# Patient Record
Sex: Female | Born: 1970 | Race: Black or African American | Hispanic: No | Marital: Single | Smoking: Former smoker
Health system: Southern US, Community
[De-identification: ages and names within clinical notes are randomized; demographics above are authoritative.]

## PROBLEM LIST (undated history)

## (undated) HISTORY — PX: ABDOMINAL HYSTERECTOMY: SHX81

---

## 2005-04-26 ENCOUNTER — Emergency Department: Payer: Self-pay | Admitting: Emergency Medicine

## 2005-05-17 ENCOUNTER — Ambulatory Visit: Payer: Self-pay | Admitting: Obstetrics and Gynecology

## 2006-05-02 ENCOUNTER — Ambulatory Visit: Payer: Self-pay | Admitting: General Practice

## 2006-05-17 ENCOUNTER — Ambulatory Visit: Payer: Self-pay | Admitting: General Practice

## 2006-09-03 ENCOUNTER — Emergency Department: Payer: Self-pay | Admitting: Unknown Physician Specialty

## 2006-10-27 IMAGING — US US PELV - US TRANSVAGINAL
1 series · 17 of 25 positions shown · non-contrast
Comparison: none

REASON FOR EXAM: Pelvic pain
COMMENTS:   LMP; HYSTERECTOMY

[Series 1: us pelv - us transvaginal · 17 of 58 slices shown]
[im 1/58]
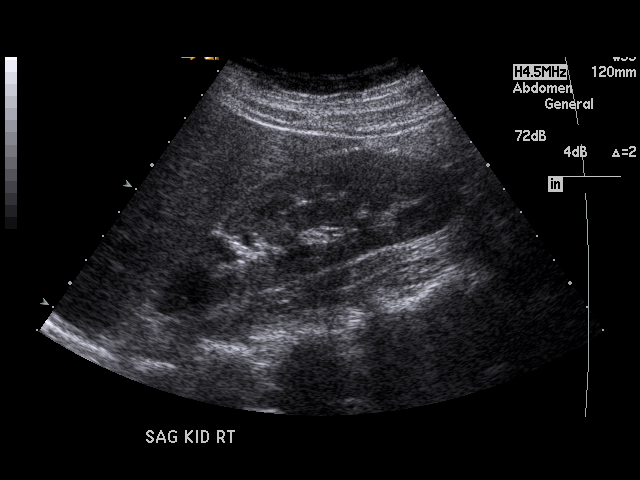
[im 5/58]
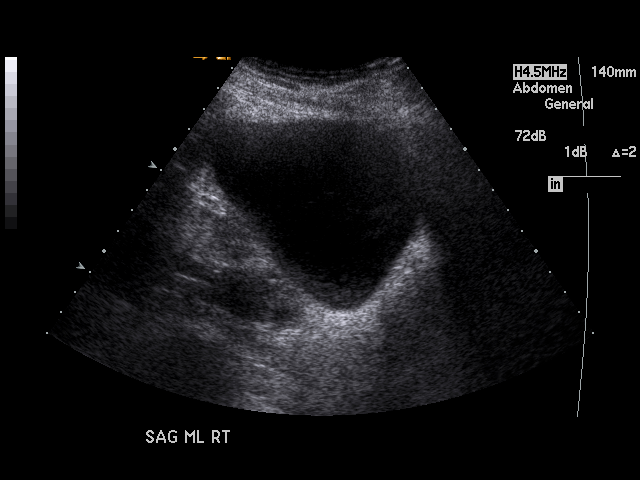
[im 8/58]
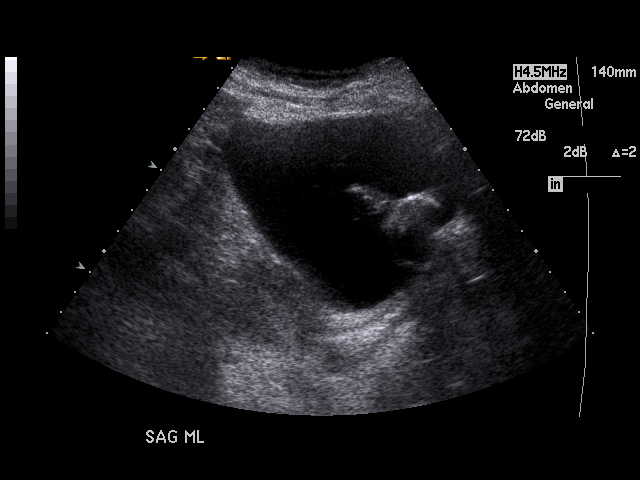
[im 12/58]
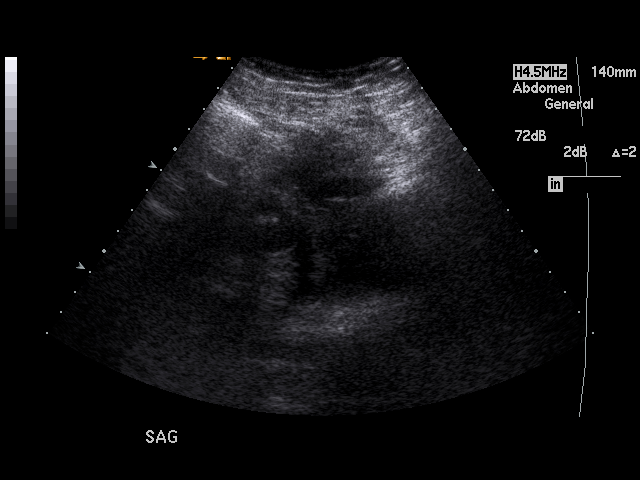
[im 15/58]
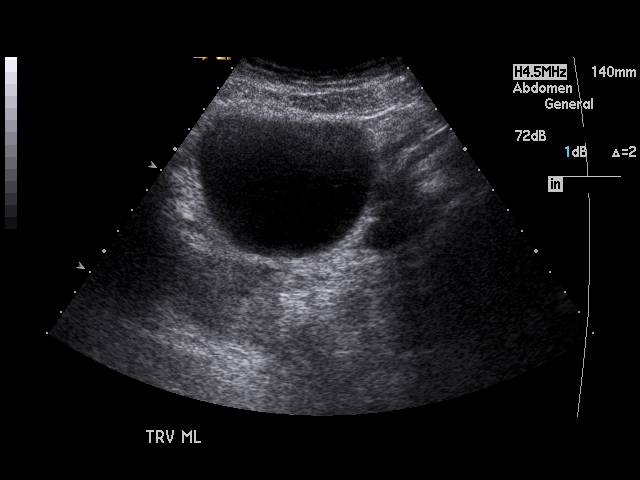
[im 20/58]
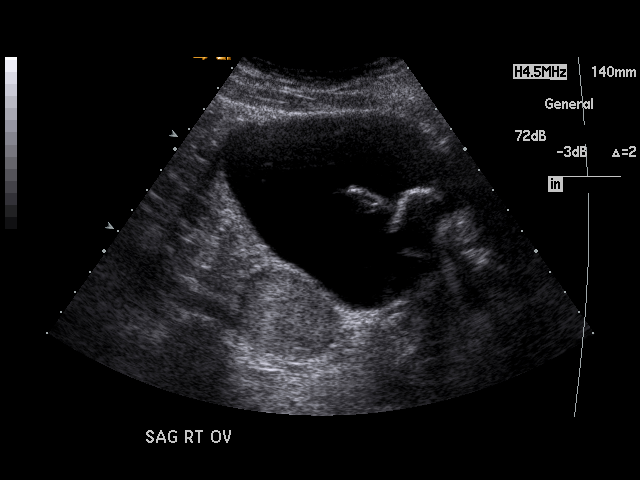
[im 22/58]
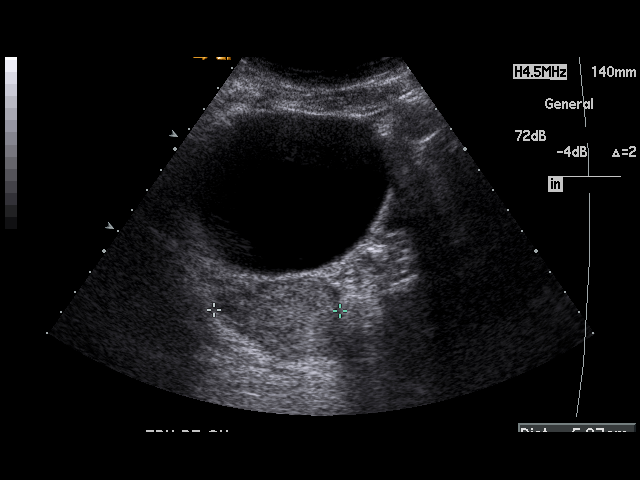
[im 27/58]
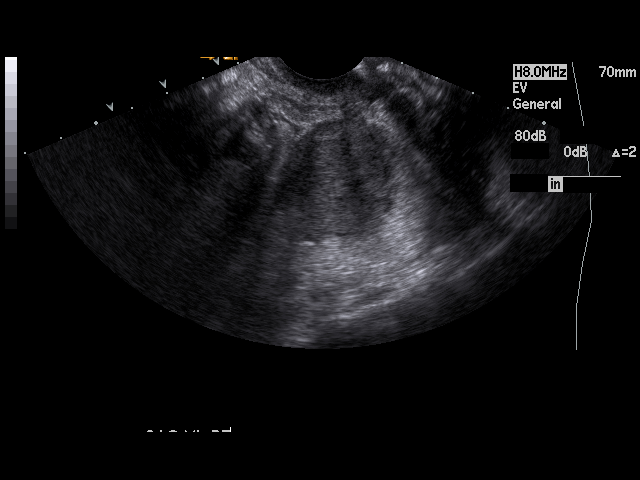
[im 29/58]
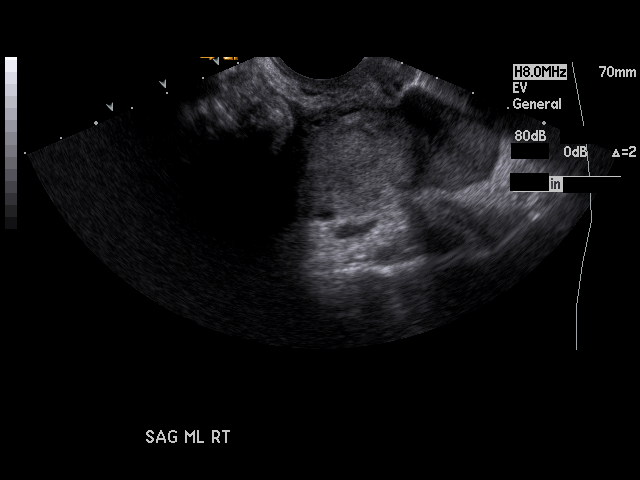
[im 31/58]
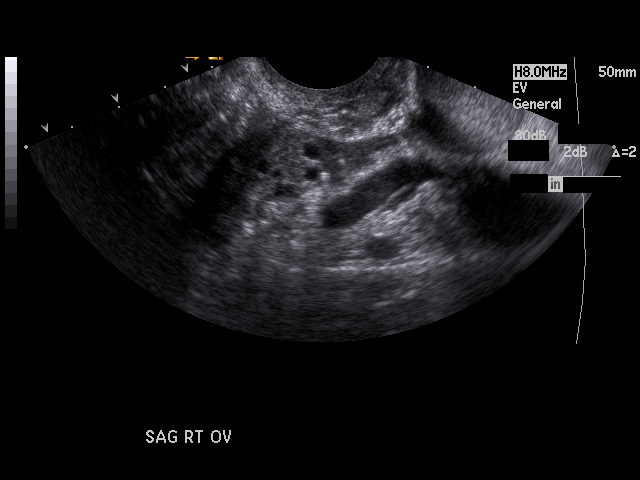
[im 36/58]
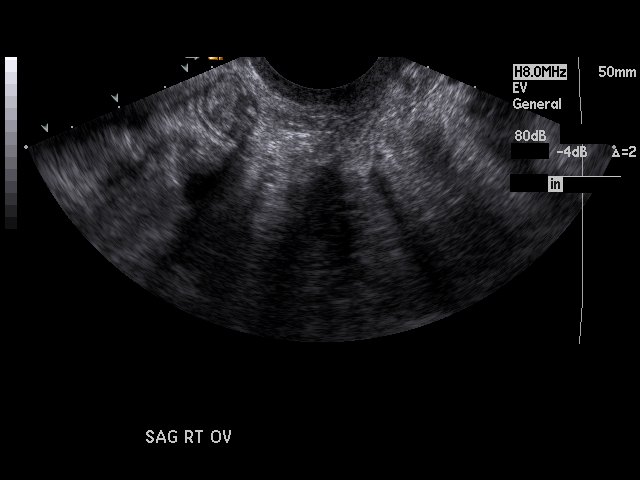
[im 39/58]
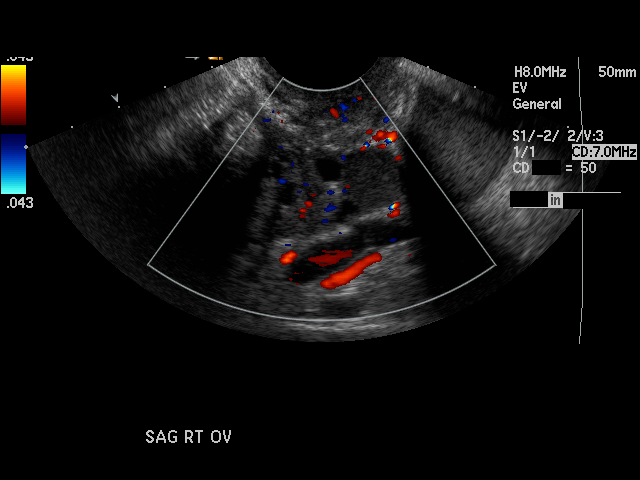
[im 43/58]
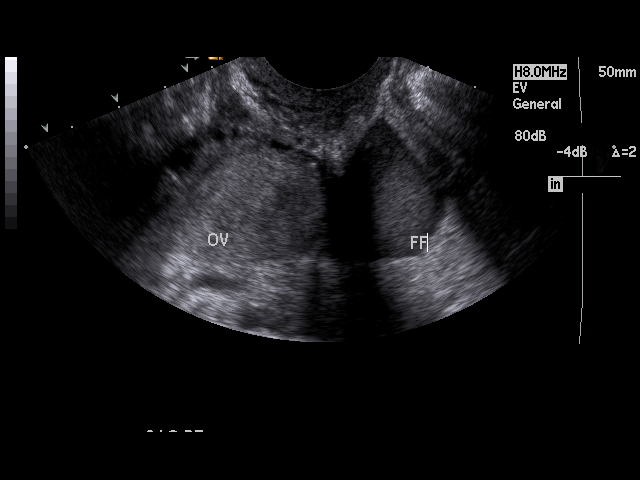
[im 46/58]
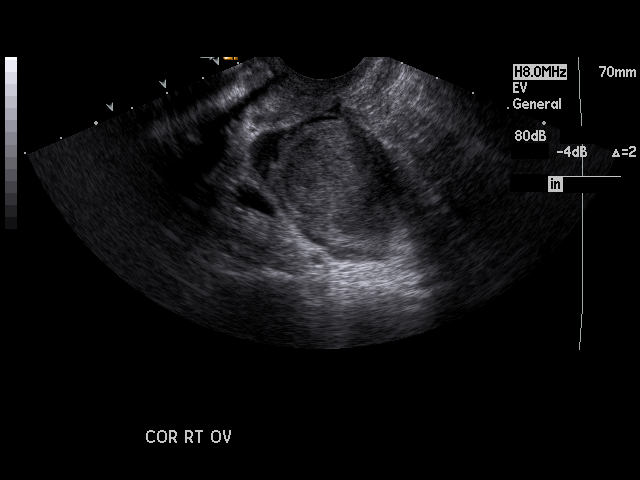
[im 50/58]
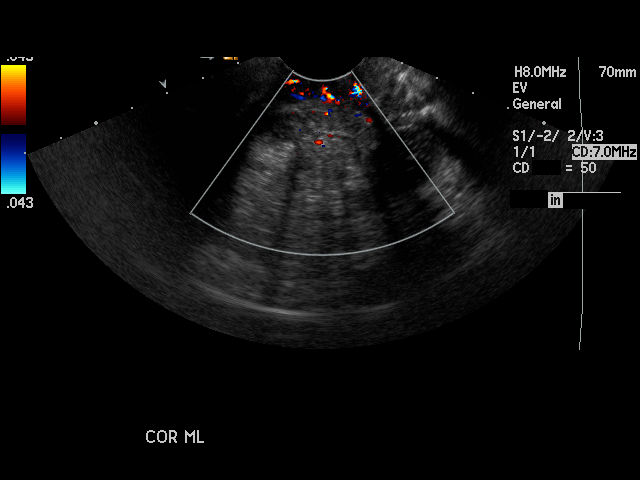
[im 53/58]
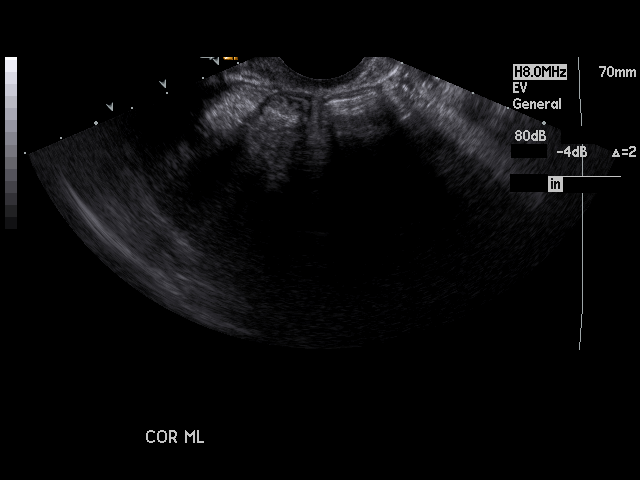
[im 58/58]
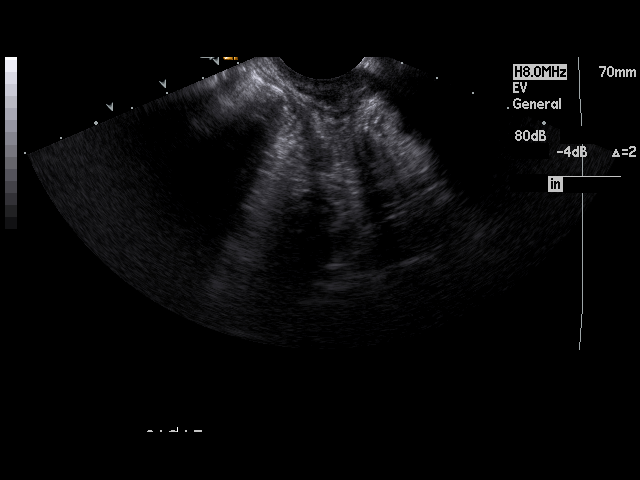

[17 of 25 positions shown; findings below may reference images not displayed]

PROCEDURE:     US  - US PELVIS MASS EXAM  - [DATE] [DATE] [DATE] [DATE]

RESULT:     The patient is status post hysterectomy. On this exam, the LEFT
ovary is not seen. It is to me uncertain as to whether the LEFT ovary has
been removed or has become atrophic. In the RIGHT adnexal area, there is a
solid mass arising from or adjacent to the RIGHT ovary. The mass measures
3.78 cm at maximum diameter. Adjacent to the anterior aspect of this mass
there is observed normal appearing ovarian tissue containing a few
follicular cysts. There is a small amount of free fluid observed on the
RIGHT. The visualized portion of the urinary bladder is normal in
appearance. The kidneys show no hydronephrosis.
IMPRESSION: 1.     There is a 3.78 cm, solid mass arising from or adjacent to the RIGHT
ovary. Although not mentioned above, the mass appears hypovascular on
Doppler examination.
2.     The LEFT ovary is not identified in this exam.
3.     The patient is status post hysterectomy.
4.     There is a small amount of free fluid in the RIGHT pelvis.

## 2006-11-17 IMAGING — CT CT PELVIS W/ CM
1 series · 15 of 27 positions shown, 19 images · non-contrast
Comparison: none

REASON FOR EXAM: Ovarian mass
COMMENTS:

[Series 2: abdomen · axial · 0.62mm/px · z∈[-362,-170]mm · 15 of 27 slices shown, 19 images]
[im 2/27  soft-tissue]
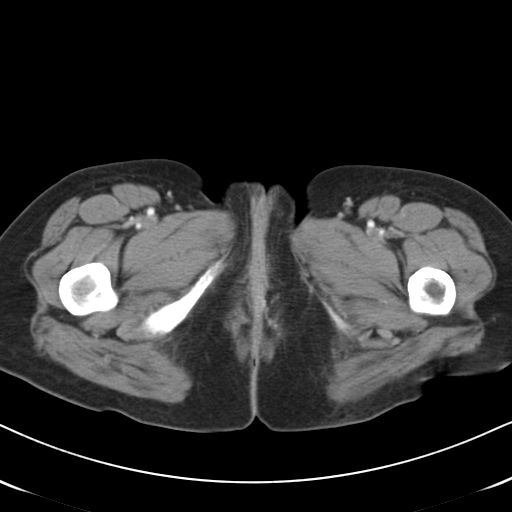
[im 2/27  bone]
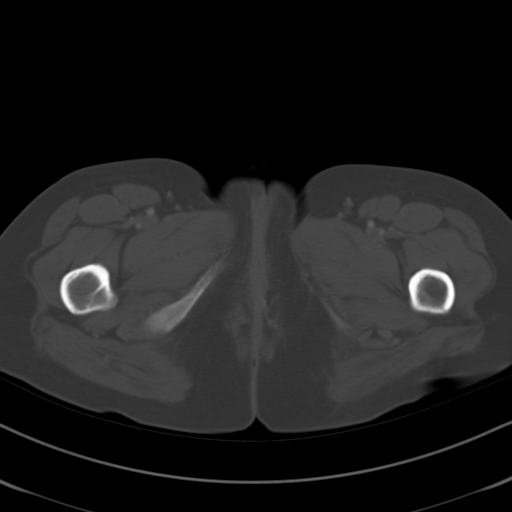
[im 4/27  soft-tissue]
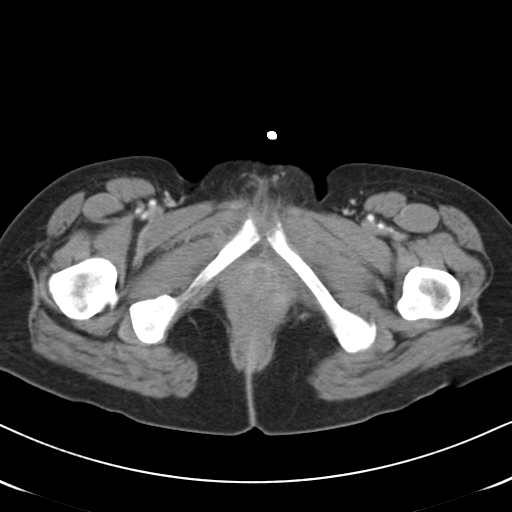
[im 6/27  soft-tissue]
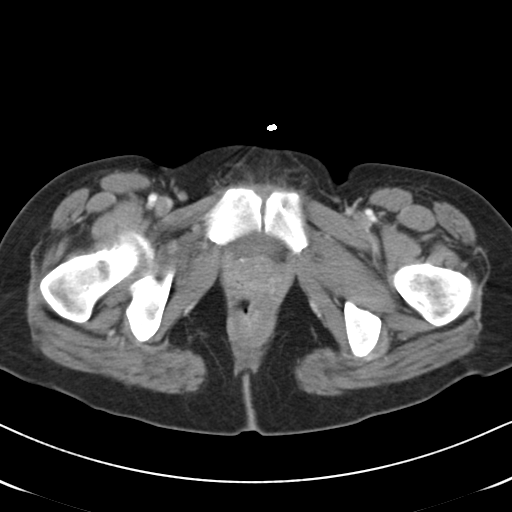
[im 8/27  soft-tissue]
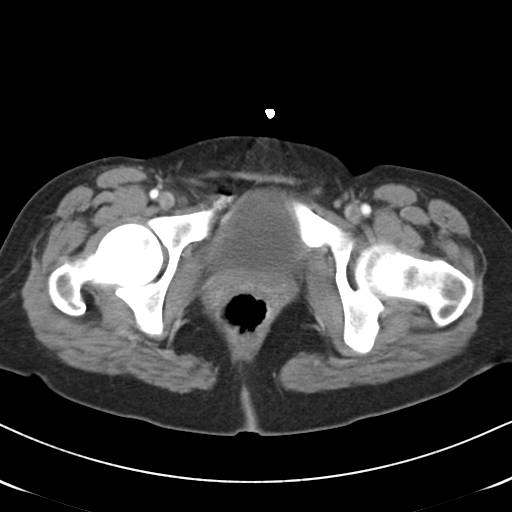
[im 10/27  soft-tissue]
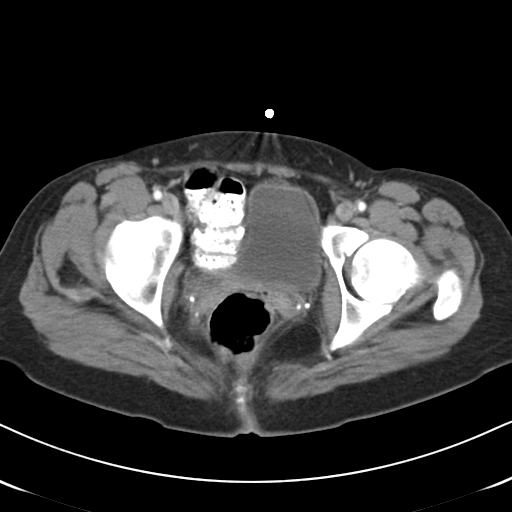
[im 12/27  soft-tissue]
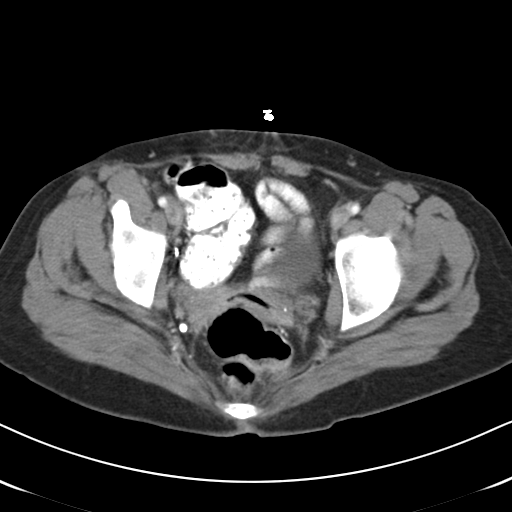
[im 14/27  soft-tissue]
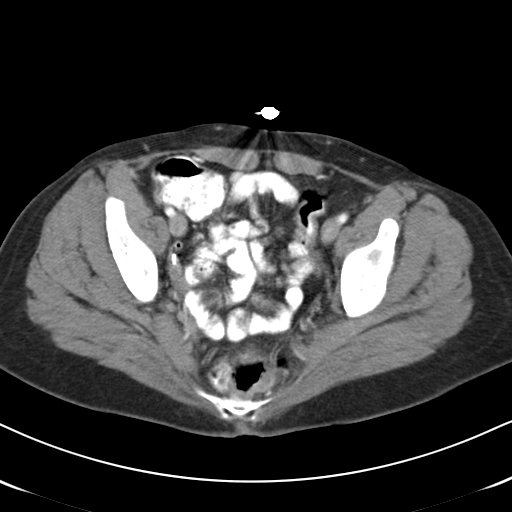
[im 16/27  soft-tissue]
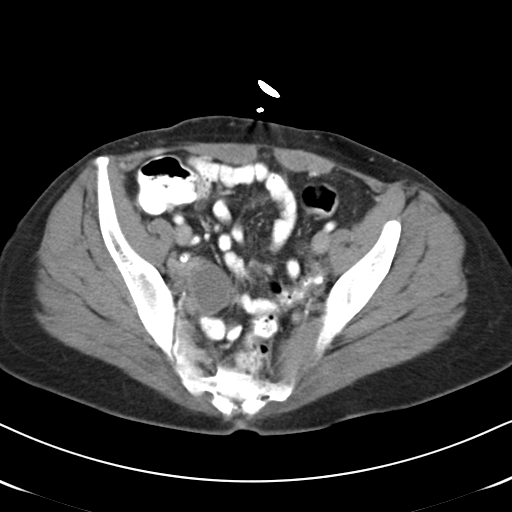
[im 18/27  soft-tissue]
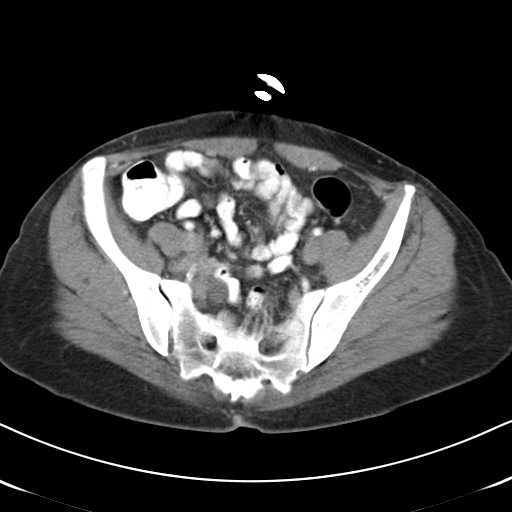
[im 18/27  bone]
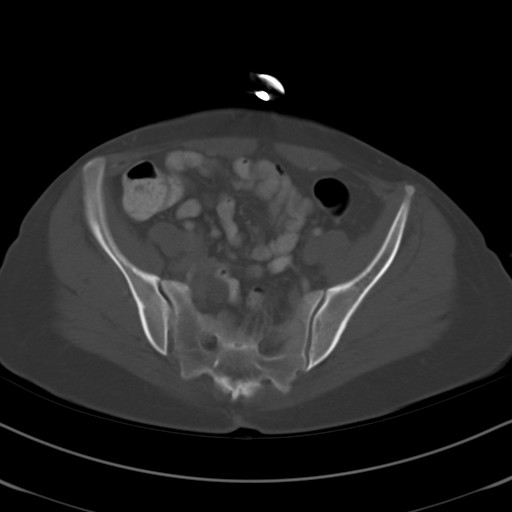
[im 20/27  soft-tissue]
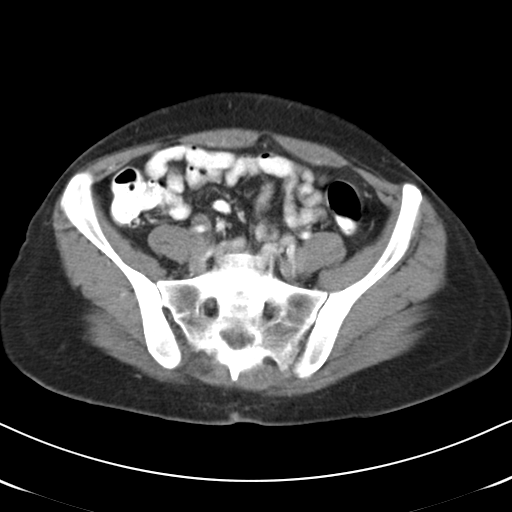
[im 22/27  soft-tissue]
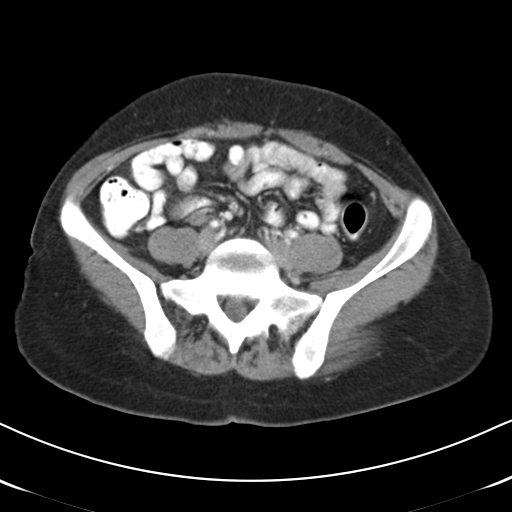
[im 23/27  lung]
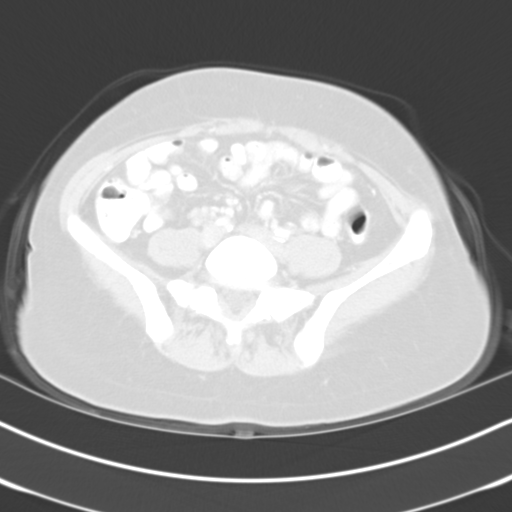
[im 24/27  soft-tissue]
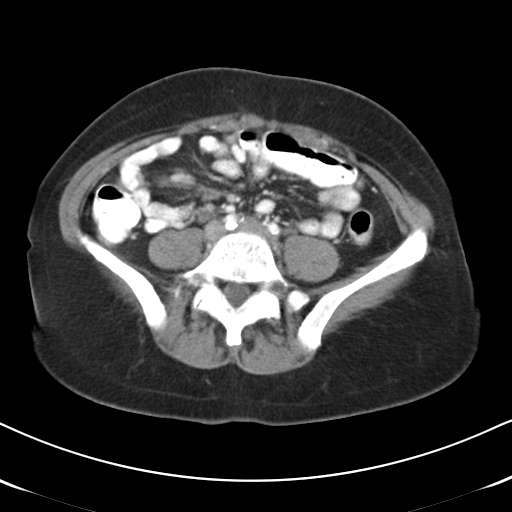
[im 24/27  lung]
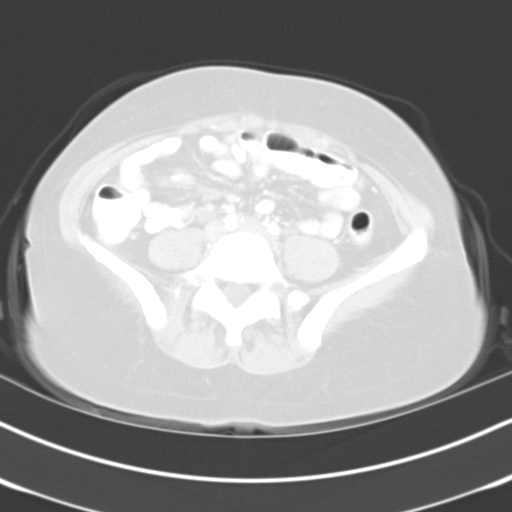
[im 25/27  lung]
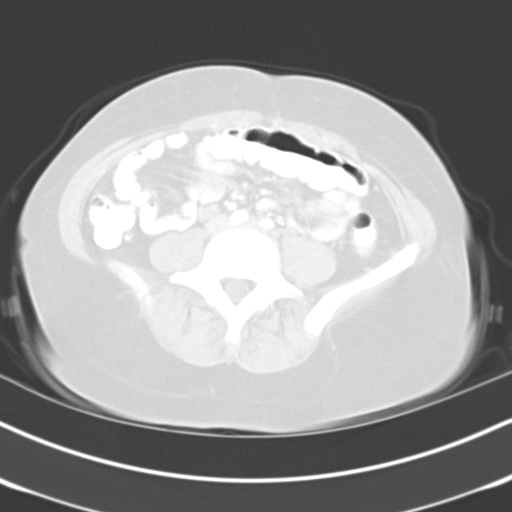
[im 26/27  soft-tissue]
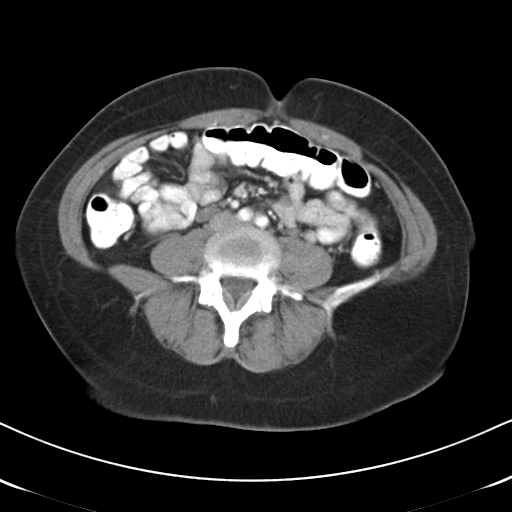
[im 26/27  lung]
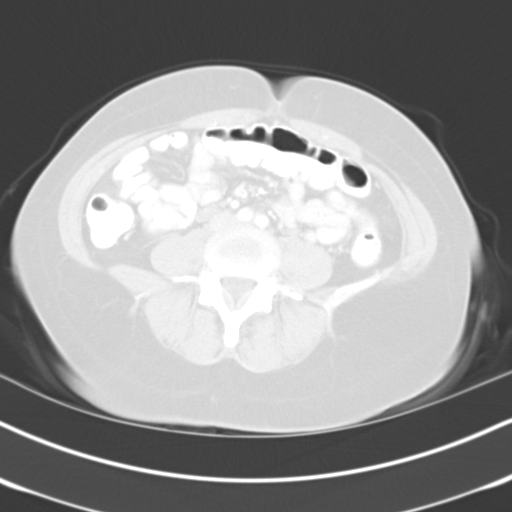

[15 of 27 positions shown; findings below may reference images not displayed]

PROCEDURE:     CT  - CT PELVIS STANDARD W  - May 17, 2005  [DATE]

RESULT:     Spiral 8-mm sections were obtained from the superior iliac crest
through the pubic symphysis status post intravenous administration of 100 ml
of Ssovue-NNY and oral contrast.

Evaluation of the pelvis demonstrates a low-attenuating mass within the
RIGHT adnexal region which appears to correspond to a previously described
mass on ultrasound dated 04/26/05.  This mass measures 2.85 x 2.43 cm in AP
x transverse dimensions.  Hounsfield units of this mass are approximately
30.  Separate RIGHT ovarian tissue is not clearly identified and this
appears to be associated with the RIGHT ovary.  Differential considerations
are possibly a complex and/or hemorrhage cyst.  Previous Doppler evaluation
demonstrated no evidence of appreciable flow within the mass.  There appears
to be peripheral soft tissue component likely representing ovarian tissue
and considering these findings again differential considerations are a
complex or hemorrhagic cyst.  Reevaluation of this area in six to eight
weeks is recommended with ultrasound if and as clinically warranted.  No
further pelvic masses, free fluid, or drainable loculated fluid collections
are identified.
IMPRESSION: Low-attenuating mass in the RIGHT adnexa as described above.  This
demonstrates Hounsfield units of 30.  There does not appear to be
appreciable enhancement within this mass and peripheral soft tissue
enhancement is appreciated likely representing surrounding ovarian tissue.
Differential considerations at this time are a complex or hemorrhagic
ovarian cyst and reevaluation with ultrasound in 6-8 weeks is recommended if
and as clinically warranted.  No further pelvic masses are identified.
Other differential considerations are possibly endometrioma which was not
mentioned above, and again this can be monitored with ultrasound.

## 2007-05-07 ENCOUNTER — Ambulatory Visit: Payer: Self-pay | Admitting: General Practice

## 2015-10-30 ENCOUNTER — Emergency Department: Payer: Self-pay

## 2015-10-30 ENCOUNTER — Encounter: Payer: Self-pay | Admitting: Emergency Medicine

## 2015-10-30 ENCOUNTER — Emergency Department
Admission: EM | Admit: 2015-10-30 | Discharge: 2015-10-30 | Disposition: A | Discharge status: Home / Self Care | Payer: Self-pay | Attending: Emergency Medicine | Admitting: Emergency Medicine

## 2015-10-30 DIAGNOSIS — Y9259 Other trade areas as the place of occurrence of the external cause: Secondary | ICD-10-CM | POA: Insufficient documentation

## 2015-10-30 DIAGNOSIS — W228XXA Striking against or struck by other objects, initial encounter: Secondary | ICD-10-CM | POA: Insufficient documentation

## 2015-10-30 DIAGNOSIS — Y939 Activity, unspecified: Secondary | ICD-10-CM | POA: Insufficient documentation

## 2015-10-30 DIAGNOSIS — F17200 Nicotine dependence, unspecified, uncomplicated: Secondary | ICD-10-CM | POA: Insufficient documentation

## 2015-10-30 DIAGNOSIS — Y999 Unspecified external cause status: Secondary | ICD-10-CM | POA: Insufficient documentation

## 2015-10-30 DIAGNOSIS — S92512A Displaced fracture of proximal phalanx of left lesser toe(s), initial encounter for closed fracture: Secondary | ICD-10-CM | POA: Insufficient documentation

## 2015-10-30 DIAGNOSIS — S92912A Unspecified fracture of left toe(s), initial encounter for closed fracture: Secondary | ICD-10-CM

## 2015-10-30 MED ORDER — OXYCODONE-ACETAMINOPHEN 5-325 MG PO TABS
1.0000 | ORAL_TABLET | Freq: Once | ORAL | Status: AC
  Administered 2015-10-30: 1 via ORAL
  Filled 2015-10-30: qty 1

## 2015-10-30 MED ORDER — IBUPROFEN 800 MG PO TABS
800.0000 mg | ORAL_TABLET | Freq: Three times a day (TID) | ORAL | Status: AC | PRN
Start: 2015-10-30 — End: ?

## 2015-10-30 MED ORDER — OXYCODONE-ACETAMINOPHEN 7.5-325 MG PO TABS: 1.0000 | ORAL_TABLET | ORAL | Status: AC | PRN

## 2015-10-30 MED ORDER — IBUPROFEN 800 MG PO TABS
800.0000 mg | ORAL_TABLET | Freq: Once | ORAL | Status: AC
  Administered 2015-10-30: 800 mg via ORAL
  Filled 2015-10-30: qty 1

## 2015-10-30 NOTE — ED Notes (Signed)
L foot pain since Wednesday, cart ran over foot in [employer], pain and swelling since.

## 2015-10-30 NOTE — ED Notes (Signed)
Discussed discharge instructions, prescriptions, and follow-up care with patient. No questions or concerns at this time. Pt stable at discharge.  

## 2015-10-30 NOTE — Discharge Instructions (Signed)
Keep toes buddy taped and wear orthopedic shoe until evaluation by orthopedic clinic

## 2015-10-30 NOTE — ED Provider Notes (Signed)
[facility] Emergency Department Provider Note  ____________________________________________  Time seen: Approximately 12:45 PM  I have reviewed the triage vital signs and the nursing notes.   HISTORY  Chief Complaint Foot Pain    HPI Tiffany Spence is a 45 y.o. female patient complaining of left dorsal foot pain secondary to contusion by shopping cart at [employer] 3 days ago. Patient states continue pain edema to the top part of her foot. Patient states that relieved for over-the-counter anti-inflammatory medications. Patient rates her pain discomfort as a 10 over 10. Patient described a pain shot. Patient stated pain increases with ambulation. No other palliative measures taken for this complaint.   History reviewed. No pertinent past medical history.  There are no active problems to display for this patient.   Past Surgical History  Procedure Laterality Date  . Abdominal hysterectomy      Current Outpatient Rx  Name  Route  Sig  Dispense  Refill  . ibuprofen (ADVIL,MOTRIN) 800 MG tablet   Oral   Take 1 tablet (800 mg total) by mouth every 8 (eight) hours as needed.   30 tablet   0   . oxyCODONE-acetaminophen (PERCOCET) 7.5-325 MG tablet   Oral   Take 1 tablet by mouth every 4 (four) hours as needed for severe pain.   20 tablet   0     Allergies Review of patient's allergies indicates no known allergies.  No family history on file.  Social History Social History  Substance Use Topics  . Smoking status: Current Some Day Smoker  . Smokeless tobacco: None  . Alcohol Use: Yes    Review of Systems Constitutional: No fever/chills Eyes: No visual changes. ENT: No sore throat. Cardiovascular: Denies chest pain. Respiratory: Denies shortness of breath. Gastrointestinal: No abdominal pain.  No nausea, no vomiting.  No diarrhea.  No constipation. Genitourinary: Negative for dysuria. Musculoskeletal: Left  foot pain. Skin: Negative for  rash. Neurological: Negative for headaches, focal weakness or numbness.    ____________________________________________   PHYSICAL EXAM:  VITAL SIGNS: ED Triage Vitals  Enc Vitals Group     BP 10/30/15 1221 144/81 mmHg     Pulse Rate 10/30/15 1221 77     Resp 10/30/15 1221 18     Temp 10/30/15 1221 98 F (36.7 C)     Temp src --      SpO2 10/30/15 1221 100 %     Weight 10/30/15 1221 125 lb (56.7 kg)     Height 10/30/15 1221  (1.651 m)     Head Cir --      Peak Flow --      Pain Score 10/30/15 1223 10     Pain Loc --      Pain Edu? --      Excl. in GC? --     Constitutional: Alert and oriented. Well appearing and in no acute distress. Eyes: Conjunctivae are normal. PERRL. EOMI. Head: Atraumatic. Nose: No congestion/rhinnorhea. Mouth/Throat: Mucous membranes are moist.  Oropharynx non-erythematous. Neck: No stridor.  No cervical spine tenderness to palpation. Hematological/Lymphatic/Immunilogical: No cervical lymphadenopathy. Cardiovascular: Normal rate, regular rhythm. Grossly normal heart sounds.  Good peripheral circulation. Respiratory: Normal respiratory effort.  No retractions. Lungs CTAB. Gastrointestinal: Soft and nontender. No distention. No abdominal bruits. No CVA tenderness. Musculoskeletal: No obvious deformity of the left foot. Moderate edema to the dorsal aspect of the left foot. Neurovascular intact. Patient atypical gait secondary to pain.  Neurologic:  Normal speech and language.  No gross focal neurologic deficits are appreciated. No gait instability. Skin:  Skin is warm, dry and intact. No rash noted. Psychiatric: Mood and affect are normal. Speech and behavior are normal.  ____________________________________________   LABS (all labs ordered are listed, but only abnormal results are displayed)  Labs Reviewed - No data to  display ____________________________________________  EKG   ____________________________________________  RADIOLOGY   fracture mid phalange fourth digit left foot. ____________________________________________   PROCEDURES  Procedure(s) performed: None  Critical Care performed: No  ____________________________________________   INITIAL IMPRESSION / ASSESSMENT AND PLAN / ED COURSE  Pertinent labs & imaging results that were available during my care of the patient were reviewed by me and considered in my medical decision making (see chart for details).  Actual toenail left foot. Discuss x-ray finding with patient. Patient feels a buddy taped and she placed an open shoe for comfort. Patient advised follow orthopedics clinic to call for pulmonary Monday morning. Patient given a prescription for Percocets and ibuprofen. ____________________________________________   FINAL CLINICAL IMPRESSION(S) / ED DIAGNOSES  Final diagnoses:  Toe fracture, left, closed, initial encounter      Joni ReiningRonald K Emmalyne Giacomo, PA-C 10/30/15 1334  Jeanmarie PlantJames A McShane, MD 10/30/15 [phone]

## 2016-10-30 ENCOUNTER — Emergency Department
Admission: EM | Admit: 2016-10-30 | Discharge: 2016-10-30 | Disposition: A | Discharge status: Home / Self Care | Payer: Self-pay | Attending: Emergency Medicine | Admitting: Emergency Medicine

## 2016-10-30 ENCOUNTER — Encounter: Payer: Self-pay | Admitting: *Deleted

## 2016-10-30 DIAGNOSIS — M5441 Lumbago with sciatica, right side: Secondary | ICD-10-CM | POA: Insufficient documentation

## 2016-10-30 DIAGNOSIS — M5431 Sciatica, right side: Secondary | ICD-10-CM

## 2016-10-30 DIAGNOSIS — F17200 Nicotine dependence, unspecified, uncomplicated: Secondary | ICD-10-CM | POA: Insufficient documentation

## 2016-10-30 MED ORDER — CYCLOBENZAPRINE HCL 10 MG PO TABS: 10.0000 mg | ORAL_TABLET | Freq: Three times a day (TID) | ORAL | 0 refills | Status: DC | PRN

## 2016-10-30 MED ORDER — HYDROCODONE-ACETAMINOPHEN 5-325 MG PO TABS: 1.0000 | ORAL_TABLET | ORAL | 0 refills | Status: AC | PRN

## 2016-10-30 MED ORDER — MELOXICAM 15 MG PO TABS: 15.0000 mg | ORAL_TABLET | Freq: Every day | ORAL | 0 refills | Status: DC

## 2016-10-30 MED ORDER — PREDNISONE 10 MG (21) PO TBPK: ORAL_TABLET | ORAL | 0 refills | Status: DC

## 2016-10-30 NOTE — ED Triage Notes (Signed)
States right hip pain for over 1 year, states pain gets worse at night, ambulatory

## 2016-10-30 NOTE — ED Notes (Signed)
See triage note   States she has had right hip pain for some time  Pain has increased over the past 3 days ambulates with slight limp

## 2016-10-30 NOTE — ED Provider Notes (Signed)
[facility] Emergency Department Provider Note ____________________________________________  Time seen: Approximately 11:18 AM  I have reviewed the triage vital signs and the nursing notes.   HISTORY  Chief Complaint Hip Pain    HPI Tiffany Spence is a 46 y.o. female who presents to the emergency department for evaluation of right hip pain. Pain intermittent since about a year ago, but has increased over the past 3 days. No new injury.Pain starts in the lower back and "runs" down the right hip and leg. No relief with ibuprofen and tylenol.  History reviewed. No pertinent past medical history.  There are no active problems to display for this patient.   Past Surgical History:  Procedure Laterality Date  . ABDOMINAL HYSTERECTOMY      Prior to Admission medications   Medication Sig Start Date End Date Taking? Authorizing Provider  cyclobenzaprine (FLEXERIL) 10 MG tablet Take 1 tablet (10 mg total) by mouth 3 (three) times daily as needed for muscle spasms. 10/30/16   Chinita Pester, FNP  HYDROcodone-acetaminophen (NORCO/VICODIN) 5-325 MG tablet Take 1 tablet by mouth every 4 (four) hours as needed for moderate pain. 10/30/16 10/30/17  Chinita Pester, FNP  ibuprofen (ADVIL,MOTRIN) 800 MG tablet Take 1 tablet (800 mg total) by mouth every 8 (eight) hours as needed. 10/30/15   Joni Reining, PA-C  meloxicam (MOBIC) 15 MG tablet Take 1 tablet (15 mg total) by mouth daily. Start this medicine after you finish the prednisone tapered dose pack 10/30/16   Sabryna Lahm B Rayburn Mundis, FNP  predniSONE (STERAPRED UNI-PAK 21 TAB) 10 MG (21) TBPK tablet Take 6 tablets on day 1 Take 5 tablets on day 2 Take 4 tablets on day 3 Take 3 tablets on day 4 Take 2 tablets on day 5 Take 1 tablet on day 6 10/30/16   Chinita Pester, FNP    Allergies Patient has no known allergies.  History reviewed. No pertinent family history.  Social History Social History  Substance Use Topics  .  Smoking status: Current Some Day Smoker  . Smokeless tobacco: Not on file  . Alcohol use Yes    Review of Systems Constitutional: No recent illness. Cardiovascular: Denies chest pain or palpitations. Respiratory: Denies shortness of breath. Musculoskeletal: Pain in right lower back with radiation into the hip. Skin: Negative for rash, wound, lesion. Neurological: Negative for focal weakness or numbness.  ____________________________________________   PHYSICAL EXAM:  VITAL SIGNS: ED Triage Vitals  Enc Vitals Group     BP 10/30/16 0927 (!) 155/85     Pulse Rate 10/30/16 0927 71     Resp 10/30/16 0927 17     Temp 10/30/16 0927 98.2 F (36.8 C)     Temp Source 10/30/16 0927 Oral     SpO2 10/30/16 0927 100 %     Weight 10/30/16 0928 130 lb (59 kg)     Height 10/30/16 0928  (1.676 m)     Head Circumference --      Peak Flow --      Pain Score 10/30/16 0933 10     Pain Loc --      Pain Edu? --      Excl. in GC? --     Constitutional: Alert and oriented. Well appearing and in no acute distress. Eyes: Conjunctivae are normal. EOMI. Head: Atraumatic. Neck: No stridor.  Respiratory: Normal respiratory effort.   Musculoskeletal: Straight leg raise positive on the right at about 40.*  Neurologic:  Normal speech  and language. No gross focal neurologic deficits are appreciated. Speech is normal. No gait instability. Skin:  Skin is warm, dry and intact. Atraumatic. Psychiatric: Mood and affect are normal. Speech and behavior are normal.  ____________________________________________   LABS (all labs ordered are listed, but only abnormal results are displayed)  Labs Reviewed - No data to display ____________________________________________  RADIOLOGY  Not indicated. ____________________________________________   PROCEDURES  Procedure(s) performed: None  ____________________________________________   INITIAL IMPRESSION / ASSESSMENT AND PLAN / ED COURSE  46  year old female who presents to the emergency department for evaluation of right low back and hip pain. Pain and symptoms consistent with acute sciatica. She will be treated with the medications listed below. She is to follow up with the PCP of her choice for symptoms that are not improving over the week. She is to return to the ER for symptoms that change or worsen if unable to schedule an appointment.  Pertinent labs & imaging results that were available during my care of the patient were reviewed by me and considered in my medical decision making (see chart for details).  _________________________________________   FINAL CLINICAL IMPRESSION(S) / ED DIAGNOSES  Final diagnoses:  Sciatica of right side    Discharge Medication List as of 10/30/2016 11:32 AM    START taking these medications   Details  cyclobenzaprine (FLEXERIL) 10 MG tablet Take 1 tablet (10 mg total) by mouth 3 (three) times daily as needed for muscle spasms., Starting Mon 10/30/2016, Print    HYDROcodone-acetaminophen (NORCO/VICODIN) 5-325 MG tablet Take 1 tablet by mouth every 4 (four) hours as needed for moderate pain., Starting Mon 10/30/2016, Until Tue 10/30/2017, Print    meloxicam (MOBIC) 15 MG tablet Take 1 tablet (15 mg total) by mouth daily. Start this medicine after you finish the prednisone tapered dose pack, Starting Mon 10/30/2016, Print    predniSONE (STERAPRED UNI-PAK 21 TAB) 10 MG (21) TBPK tablet Take 6 tablets on day 1 Take 5 tablets on day 2 Take 4 tablets on day 3 Take 3 tablets on day 4 Take 2 tablets on day 5 Take 1 tablet on day 6, Print        If controlled substance prescribed during this visit, 12 month history viewed on the [facility] prior to issuing an initial prescription for Schedule II or III opiod.    Chinita Pester, FNP 11/01/16 1515    Governor Rooks, MD 11/07/16 [phone]

## 2017-05-01 IMAGING — CR DG FOOT COMPLETE 3+V*L*
1 series · 3 of 3 positions shown · non-contrast
Comparison: No priors.

CLINICAL DATA: 44-year-old female with history of trauma after left
foot was run over by a shopping cart, complaining of pain across the
top of the distal third, fourth and fifth metatarsals.

EXAM:
LEFT FOOT - COMPLETE 3+ VIEW

[Series 1: x foot ap left · 0.14mm/px · 3 of 3 slices shown]
[im 1/3]
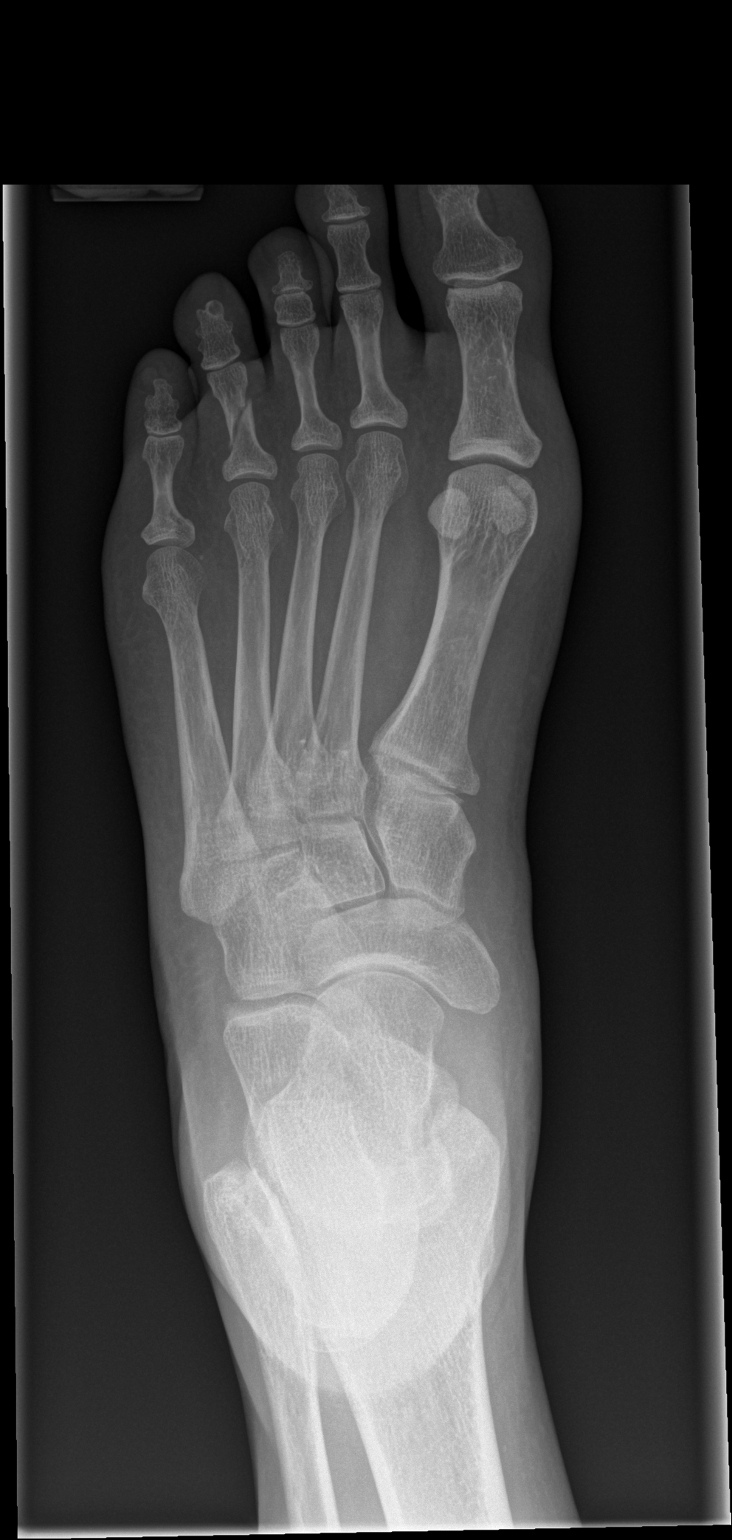
[im 2/3]
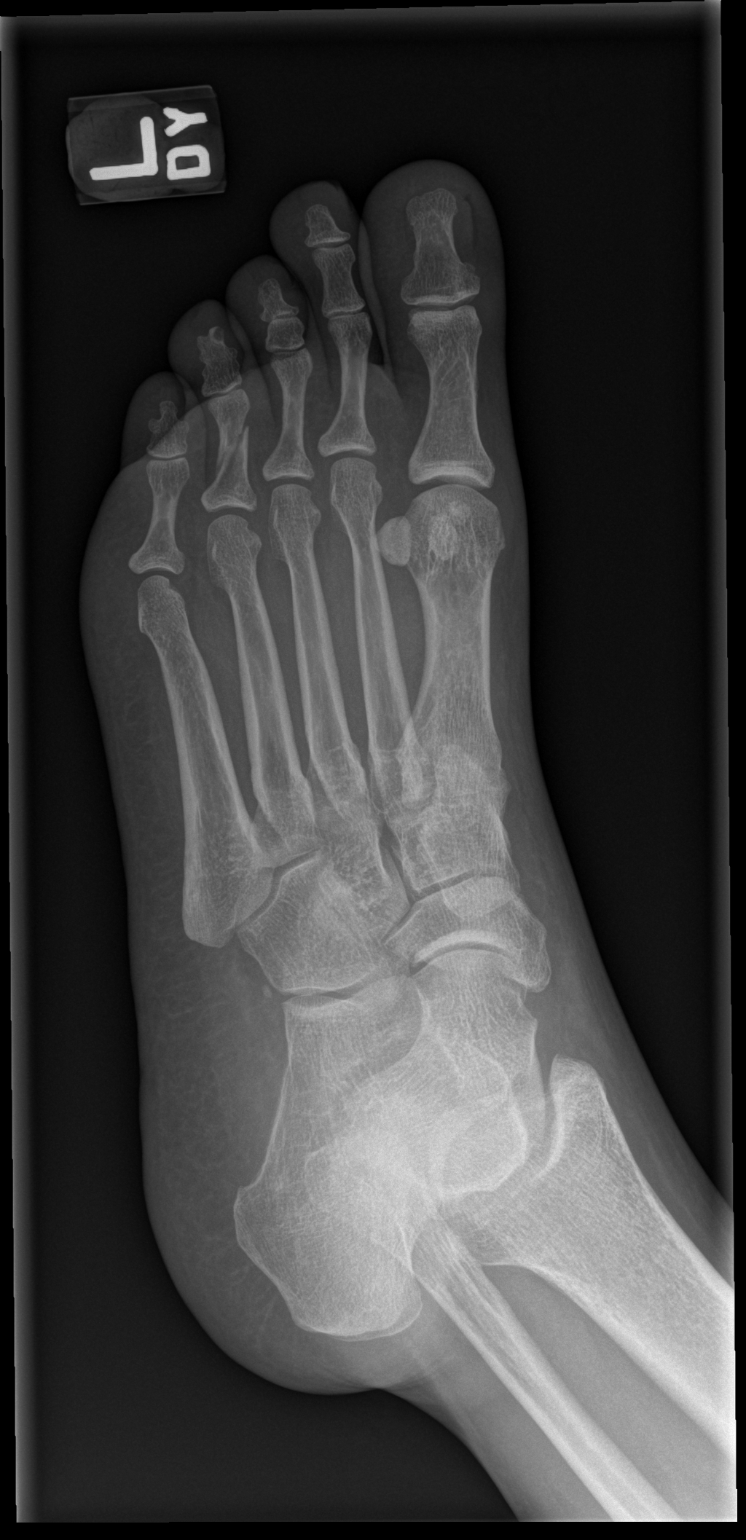
[im 3/3]
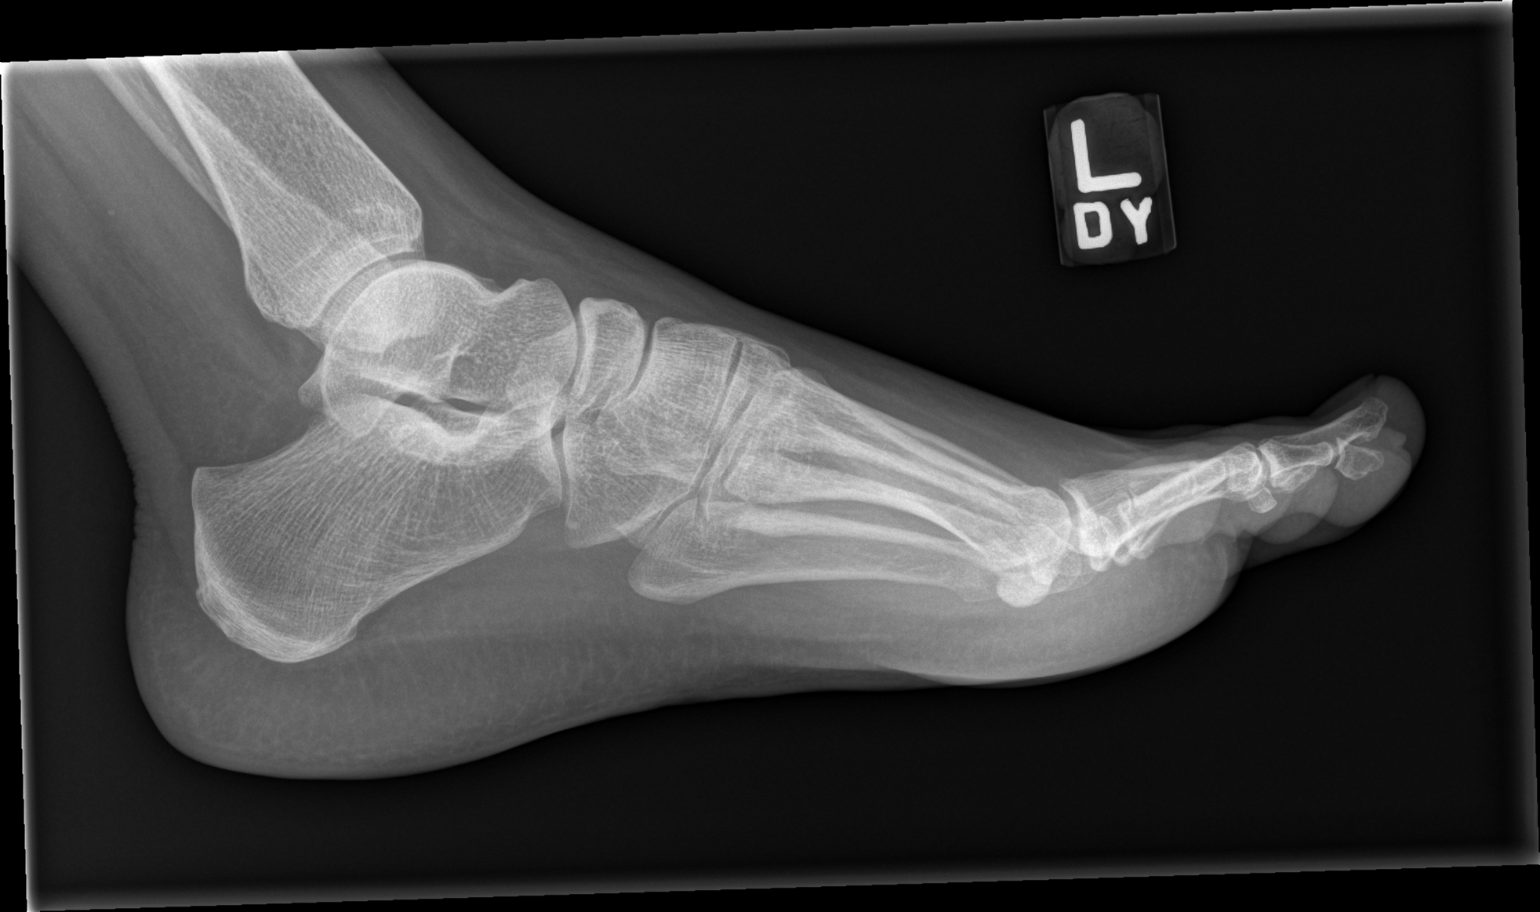

[3 of 3 positions shown; findings below may reference images not displayed]

FINDINGS: Oblique minimally displaced fracture of the fourth proximal phalanx
in the region of the diaphysis. 1 mm of lateral displacement of the
distal fracture fragment. No other acute displaced fracture,
subluxation or dislocation is noted. Specifically, the metatarsals
are intact.
IMPRESSION: 1. Oblique minimally displaced fracture of the fourth proximal
phalanx, as above.

## 2021-08-04 ENCOUNTER — Ambulatory Visit: Payer: Self-pay | Admitting: *Deleted

## 2021-08-04 ENCOUNTER — Emergency Department
Admission: EM | Admit: 2021-08-04 | Discharge: 2021-08-04 | Disposition: A | Discharge status: Home / Self Care | Payer: Self-pay | Attending: Emergency Medicine | Admitting: Emergency Medicine

## 2021-08-04 ENCOUNTER — Other Ambulatory Visit: Payer: Self-pay

## 2021-08-04 DIAGNOSIS — S01511A Laceration without foreign body of lip, initial encounter: Secondary | ICD-10-CM | POA: Insufficient documentation

## 2021-08-04 DIAGNOSIS — W19XXXA Unspecified fall, initial encounter: Secondary | ICD-10-CM | POA: Insufficient documentation

## 2021-08-04 DIAGNOSIS — L03211 Cellulitis of face: Secondary | ICD-10-CM | POA: Insufficient documentation

## 2021-08-04 DIAGNOSIS — Z23 Encounter for immunization: Secondary | ICD-10-CM | POA: Insufficient documentation

## 2021-08-04 MED ORDER — CEFTRIAXONE SODIUM 1 G IJ SOLR
1.0000 g | Freq: Once | INTRAMUSCULAR | Status: AC
  Administered 2021-08-04: 1 g via INTRAMUSCULAR
  Filled 2021-08-04: qty 10

## 2021-08-04 MED ORDER — HYDROCODONE-ACETAMINOPHEN 5-325 MG PO TABS: 1.0000 | ORAL_TABLET | Freq: Four times a day (QID) | ORAL | 0 refills | Status: AC | PRN

## 2021-08-04 MED ORDER — CLINDAMYCIN PHOSPHATE 600 MG/4ML IJ SOLN: 600.0000 mg | Freq: Once | INTRAMUSCULAR | Status: DC

## 2021-08-04 MED ORDER — CLINDAMYCIN HCL 300 MG PO CAPS: 300.0000 mg | ORAL_CAPSULE | Freq: Four times a day (QID) | ORAL | 0 refills | Status: AC

## 2021-08-04 MED ORDER — ONDANSETRON 4 MG PO TBDP: 4.0000 mg | ORAL_TABLET | Freq: Three times a day (TID) | ORAL | 0 refills | Status: AC | PRN

## 2021-08-04 MED ORDER — TETANUS-DIPHTH-ACELL PERTUSSIS 5-2.5-18.5 LF-MCG/0.5 IM SUSY
0.5000 mL | PREFILLED_SYRINGE | Freq: Once | INTRAMUSCULAR | Status: AC
  Administered 2021-08-04: 0.5 mL via INTRAMUSCULAR
  Filled 2021-08-04: qty 0.5

## 2021-08-04 MED ORDER — AMOXICILLIN-POT CLAVULANATE 875-125 MG PO TABS: 1.0000 | ORAL_TABLET | Freq: Two times a day (BID) | ORAL | 0 refills | Status: AC

## 2021-08-04 NOTE — Telephone Encounter (Signed)
° ° ° ° °  Chief Complaint: Mouth injury S/P fall Symptoms: Drainage "Pus" from lesion at lip, gum. Frequency: Saturday S/P fall Pertinent Negatives: Patient denies fevr, tooth injury Disposition: [] ED /[] Urgent Care (no appt availability in office) / [] Appointment(In office/virtual)/ []  [facility]/ [] Home Care/ [] Refused Recommended Disposition /[] [facility]/ []  Follow-up with PCP Additional Notes: Advised UC, Pt needs tetanus as well.   Reason for Disposition  [1] No prior tetanus shots (or is not fully vaccinated) AND [2] any wound (e.g., cut or scrape)  Answer Assessment - Initial Assessment Questions 1. MECHANISM: "How did the fall happen?"     Saturday 2. DOMESTIC VIOLENCE AND ELDER ABUSE SCREENING: "Did you fall because someone pushed you or tried to hurt you?" If Yes, ask: "Are you safe now?"     no 3. ONSET: "When did the fall happen?" (e.g., minutes, hours, or days ago)     Tripped 4. LOCATION: "What part of the body hit the ground?" (e.g., back, buttocks, head, hips, knees, hands, head, stomach)     [place], lip 5. INJURY: "Did you hurt (injure) yourself when you fell?" If Yes, ask: "What did you injure? Tell me more about this?" (e.g., body area; type of injury; pain severity)"     Mouth 6. PAIN: "Isthere any pain?" If Yes, ask: "How bad is the pain?" (e.g., Scale 1-10; or mild,  moderate, severe)   - NONE (0): No pain   - MILD (1-3): Doesn't interfere with normal activities    - MODERATE (4-7): Interferes with normal activities or awakens from sleep    - SEVERE (8-10): Excruciating pain, unable to do any normal activities      *No Answer* 7. SIZE: For cuts, bruises, or swelling, ask: "How large is it?" (e.g., inches or centimeters)        9. OTHER SYMPTOMS: "Do you have any other symptoms?" (e.g., dizziness, fever, weakness; new onset or worsening).      no 10. CAUSE: "What do you think caused the fall (or falling)?" (e.g., tripped, dizzy spell)        Tripped  Protocols used: Falls and [facility]

## 2021-08-04 NOTE — ED Triage Notes (Signed)
Pt states she busted her lower lip a few days ago and is having some foul drainage and is concerned for infection

## 2021-08-04 NOTE — Discharge Instructions (Addendum)
Take Clindamycin four times daily for the next seven days.  Take Augmentin twice daily for the next seven daysl You have been prescribed Norco for pain. You can take Zofran with Norco to avoid nausea. Please start probiotic in order to introduce good gut bacteria back in your body. Return to the emergency department with worsening pain or redness/streaking around the face.

## 2021-08-04 NOTE — ED Provider Notes (Addendum)
[facility] Provider Note  Patient Contact: 4:12 PM (approximate)   History   Laceration   HPI  Tiffany Spence is a 51 y.o. female presents to the emergency department with concern for lower lip cellulitis.  Patient had a mechanical fall 2 to 3 days ago.  Patient states that she has noticed some worsening pain and expression of purulence as well as a malodorous smell from wound site.  She denies history of cellulitis in the past.  Patient also states it has been over 10 years since she has had tetanus shot.      Physical Exam   Triage Vital Signs: ED Triage Vitals  Enc Vitals Group     BP 08/04/21 1303 (!) 168/98     Pulse Rate 08/04/21 1303 80     Resp 08/04/21 1303 16     Temp 08/04/21 1303 98.1 F (36.7 C)     Temp Source 08/04/21 1303 Oral     SpO2 08/04/21 1303 98 %     Weight 08/04/21 1424 130 lb 1.1 oz (59 kg)     Height 08/04/21 1424 5\' 6"  (1.676 m)     Head Circumference --      Peak Flow --      Pain Score --      Pain Loc --      Pain Edu? --      Excl. in GC? --     Most recent vital signs: Vitals:   08/04/21 1303  BP: (!) 168/98  Pulse: 80  Resp: 16  Temp: 98.1 F (36.7 C)  SpO2: 98%     General: Alert and in no acute distress. Eyes:  PERRL. EOMI. Head: No acute traumatic findings ENT:      Nose: No congestion/rhinnorhea.      Mouth/Throat: Mucous membranes are moist.  Patient has macerated lower lip laceration with overlying slough and scabbing.  Lower lip appears mildly edematous with no frank expression of purulence. Neck: No stridor. No cervical spine tenderness to palpation. Cardiovascular:  Good peripheral perfusion Respiratory: Normal respiratory effort without tachypnea or retractions. Lungs CTAB. Good air entry to the bases with no decreased or absent breath sounds Gastrointestinal: Bowel sounds 4 quadrants. Soft and nontender to palpation. No guarding or rigidity. No palpable masses. No distention. No CVA  tenderness. Musculoskeletal: Full range of motion to all extremities.  Neurologic:  No gross focal neurologic deficits are appreciated.  Skin:   No rash noted Other:   ED Results / Procedures / Treatments   Labs (all labs ordered are listed, but only abnormal results are displayed) Labs Reviewed - No data to display     PROCEDURES:  Critical Care performed: No  Procedures   MEDICATIONS ORDERED IN ED: Medications  clindamycin (CLEOCIN) injection 600 mg (has no administration in time range)  Tdap (BOOSTRIX) injection 0.5 mL (has no administration in time range)     IMPRESSION / MDM / ASSESSMENT AND PLAN / ED COURSE  I reviewed the triage vital signs and the nursing notes.                              Assessment and plan:  Facial Cellulitis:  Differential diagnosis includes, but is not limited to, facial cellulitis  51 year old female presents to the emergency department with lower lip cellulitis.  Patient was hypertensive at triage but vital signs were otherwise reassuring.  Patient was alert, active  and nontoxic-appearing.  Patient did have cellulitis of the lower lip with no frank expression of purulence.  Low suspicion for abscess.  Will give patient IM ceftriaxone and discharge patient with oral clindamycin and Augmentin.  Spoke with pharmacist on-call Wallace Going who recommended Augmentin and Rocephin for Eikenella coverage. I recommended taking a probiotic while taking antibiotic.  Patient was prescribed a short course of Norco for pain. Return precautions were given to return if symptoms seem to be worsening at home.      FINAL CLINICAL IMPRESSION(S) / ED DIAGNOSES   Final diagnoses:  Facial cellulitis     Rx / DC Orders   ED Discharge Orders          Ordered    clindamycin (CLEOCIN) 300 MG capsule  4 times daily        08/04/21 1607    HYDROcodone-acetaminophen (NORCO) 5-325 MG tablet  Every 6 hours PRN        08/04/21 1607    ondansetron  (ZOFRAN-ODT) 4 MG disintegrating tablet  Every 8 hours PRN        08/04/21 1607             Note:  This document was prepared using Dragon voice recognition software and may include unintentional dictation errors.   Pia Mau Denham, PA-C 08/04/21 1616    Pia Mau [place], PA-C 08/04/21 1628    Gilles Chiquito, MD 08/04/21 1759

## 2021-08-04 NOTE — ED Notes (Signed)
See triage note  presents s/p fall   states she slipped and fell down face first  laceration and swelling noted to lower lip

## 2024-07-14 ENCOUNTER — Other Ambulatory Visit: Payer: Self-pay

## 2024-07-14 ENCOUNTER — Emergency Department: Payer: Self-pay

## 2024-07-14 ENCOUNTER — Emergency Department
Admission: EM | Admit: 2024-07-14 | Discharge: 2024-07-14 | Disposition: A | Discharge status: Home / Self Care | Payer: Self-pay | Attending: Emergency Medicine | Admitting: Emergency Medicine

## 2024-07-14 DIAGNOSIS — K610 Anal abscess: Secondary | ICD-10-CM | POA: Diagnosis not present

## 2024-07-14 DIAGNOSIS — R102 Pelvic and perineal pain unspecified side: Secondary | ICD-10-CM | POA: Diagnosis present

## 2024-07-14 LAB — COMPREHENSIVE METABOLIC PANEL WITH GFR
ALT: <5 U/L (ref 0–44)
AST: 17 U/L (ref 15–41)
Albumin: 3.4 g/dL — ABNORMAL LOW (ref 3.5–5.0)
Alkaline Phosphatase: 137 U/L — ABNORMAL HIGH (ref 38–126)
Anion gap: 15 (ref 5–15)
BUN: 8 mg/dL (ref 6–20)
CO2: 21 mmol/L — ABNORMAL LOW (ref 22–32)
Calcium: 8.7 mg/dL — ABNORMAL LOW (ref 8.9–10.3)
Chloride: 102 mmol/L (ref 98–111)
Creatinine, Ser: 0.68 mg/dL (ref 0.44–1.00)
GFR, Estimated: >60 mL/min
Glucose, Bld: 99 mg/dL (ref 70–99)
Potassium: 3.4 mmol/L — ABNORMAL LOW (ref 3.5–5.1)
Sodium: 138 mmol/L (ref 135–145)
Total Bilirubin: 1 mg/dL (ref 0.0–1.2)
Total Protein: 7.4 g/dL (ref 6.5–8.1)

## 2024-07-14 LAB — CBC WITH DIFFERENTIAL/PLATELET
Abs Immature Granulocytes: 0.11 K/uL — ABNORMAL HIGH (ref 0.00–0.07)
Basophils Absolute: 0.1 K/uL (ref 0.0–0.1)
Basophils Relative: 0 %
Eosinophils Absolute: 0 K/uL (ref 0.0–0.5)
Eosinophils Relative: 0 %
HCT: 39.6 % (ref 36.0–46.0)
Hemoglobin: 13.3 g/dL (ref 12.0–15.0)
Immature Granulocytes: 1 %
Lymphocytes Relative: 16 %
Lymphs Abs: 2.6 K/uL (ref 0.7–4.0)
MCH: 30.6 pg (ref 26.0–34.0)
MCHC: 33.6 g/dL (ref 30.0–36.0)
MCV: 91.2 fL (ref 80.0–100.0)
Monocytes Absolute: 1.3 K/uL — ABNORMAL HIGH (ref 0.1–1.0)
Monocytes Relative: 8 %
Neutro Abs: 12.9 K/uL — ABNORMAL HIGH (ref 1.7–7.7)
Neutrophils Relative %: 75 %
Platelets: 414 K/uL — ABNORMAL HIGH (ref 150–400)
RBC: 4.34 MIL/uL (ref 3.87–5.11)
RDW: 13.2 % (ref 11.5–15.5)
WBC: 17 K/uL — ABNORMAL HIGH (ref 4.0–10.5)
nRBC: 0 % (ref 0.0–0.2)

## 2024-07-14 MED ORDER — IOHEXOL 300 MG/ML  SOLN
100.0000 mL | Freq: Once | INTRAMUSCULAR | Status: AC | PRN
  Administered 2024-07-14: 100 mL via INTRAVENOUS

## 2024-07-14 MED ORDER — LIDOCAINE HCL (PF) 1 % IJ SOLN
3.0000 mL | INTRAMUSCULAR | Status: DC
  Administered 2024-07-14: 3 mL

## 2024-07-14 MED ORDER — LIDOCAINE-PRILOCAINE 2.5-2.5 % EX CREA: TOPICAL_CREAM | CUTANEOUS | Status: DC

## 2024-07-14 MED ORDER — LIDOCAINE HCL (PF) 1 % IJ SOLN
5.0000 mL | Freq: Once | INTRAMUSCULAR | Status: AC
  Administered 2024-07-14: 5 mL
  Filled 2024-07-14: qty 5

## 2024-07-14 MED ORDER — LIDOCAINE-PRILOCAINE 2.5-2.5 % EX CREA
TOPICAL_CREAM | Freq: Once | CUTANEOUS | Status: AC
  Filled 2024-07-14: qty 5

## 2024-07-14 MED ORDER — CEPHALEXIN 500 MG PO CAPS: 500.0000 mg | ORAL_CAPSULE | Freq: Three times a day (TID) | ORAL | 0 refills | Status: DC

## 2024-07-14 MED ORDER — OXYCODONE HCL 5 MG PO TABS
5.0000 mg | ORAL_TABLET | Freq: Once | ORAL | Status: AC
  Administered 2024-07-14: 5 mg via ORAL
  Filled 2024-07-14: qty 1

## 2024-07-14 MED ORDER — DOXYCYCLINE HYCLATE 100 MG PO CAPS: 100.0000 mg | ORAL_CAPSULE | Freq: Two times a day (BID) | ORAL | 0 refills | Status: DC

## 2024-07-14 MED ORDER — OXYCODONE-ACETAMINOPHEN 5-325 MG PO TABS: 1.0000 | ORAL_TABLET | Freq: Four times a day (QID) | ORAL | 0 refills | Status: DC | PRN

## 2024-07-14 NOTE — ED Provider Notes (Signed)
 "  [facility] Provider Note    Event Date/Time   First MD Initiated Contact with Patient 07/14/24 [phone]     (approximate)   History   Hemorrhoids   HPI  Tiffany Spence is a 53 y.o. female presents to the ED with complaint of hemorrhoids.  Patient has been using over-the-counter hemorrhoid cream without any relief.  She has a history of hemorrhoids.      Physical Exam   Triage Vital Signs: ED Triage Vitals  Encounter Vitals Group     BP 07/14/24 0824 (!) 153/114     Girls Systolic BP Percentile --      Girls Diastolic BP Percentile --      Boys Systolic BP Percentile --      Boys Diastolic BP Percentile --      Pulse Rate 07/14/24 0824 (!) 110     Resp 07/14/24 0824 18     Temp 07/14/24 0824 97.6 F (36.4 C)     Temp src --      SpO2 07/14/24 0824 100 %     Weight 07/14/24 0822 113 lb (51.3 kg)     Height 07/14/24 0822 5' 5 (1.651 m)     Head Circumference --      Peak Flow --      Pain Score 07/14/24 0822 10     Pain Loc --      Pain Education --      Exclude from Growth Chart --     Most recent vital signs: Vitals:   07/14/24 1058 07/14/24 1243  BP: (!) 148/90 (!) 140/88  Pulse: 99 88  Resp: 18 18  Temp:  98 F (36.7 C)  SpO2: 100% 99%     General: Awake, no distress.  Unable to sit secondary to pain. CV:  Good peripheral perfusion.  Resp:  Normal effort.  Abd:  No distention.  Other:  Rectal exam shows an area left lateral with soft tissue edema and markedly tender to light palpation.  No drainage.   ED Results / Procedures / Treatments   Labs (all labs ordered are listed, but only abnormal results are displayed) Labs Reviewed  CBC WITH DIFFERENTIAL/PLATELET - Abnormal; Notable for the following components:      Result Value   WBC 17.0 (*)    Platelets 414 (*)    Neutro Abs 12.9 (*)    Monocytes Absolute 1.3 (*)    Abs Immature Granulocytes 0.11 (*)    All other components within normal limits  COMPREHENSIVE  METABOLIC PANEL WITH GFR - Abnormal; Notable for the following components:   Potassium 3.4 (*)    CO2 21 (*)    Calcium 8.7 (*)    Albumin 3.4 (*)    Alkaline Phosphatase 137 (*)    All other components within normal limits     RADIOLOGY CT abdomen pelvis with contrast per radiology is positive for left perianal abscess.    PROCEDURES:  Critical Care performed:   .Incision and Drainage  Date/Time: 07/14/2024 12:55 PM  Performed by: Saunders Shona CROME, PA-C Authorized by: Saunders Shona CROME, PA-C   Consent:    Consent obtained:  Verbal   Consent given by:  Patient   Risks discussed:  Incomplete drainage, infection and pain Universal protocol:    Patient identity confirmed:  Verbally with patient Location:    Type:  Abscess   Location:  Anogenital   Anogenital location:  Perianal Pre-procedure details:    Skin  preparation:  Antiseptic wash Sedation:    Sedation type:  None Anesthesia:    Anesthesia method:  Topical application and local infiltration   Topical anesthetic:  Lidocaine -prilocaine 2.5-2.5 %   Local anesthetic:  3 mL lidocaine  (PF) 1 % Procedure type:    Complexity:  Simple Procedure details:    Incision types:  Single straight   Incision depth:  Dermal   Drainage:  Purulent   Drainage amount:  Copious   Wound treatment:  Drain placed   Packing materials:  1/4 in iodoform gauze Post-procedure details:    Procedure completion:  Tolerated    MEDICATIONS ORDERED IN ED: Medications  oxyCODONE  (Oxy IR/ROXICODONE ) immediate release tablet 5 mg (5 mg Oral Given 07/14/24 0855)  iohexol  (OMNIPAQUE ) 300 MG/ML solution 100 mL (100 mLs Intravenous Contrast Given 07/14/24 0949)  lidocaine  (PF) (XYLOCAINE ) 1 % injection 5 mL (5 mLs Infiltration Given 07/14/24 1236)  lidocaine -prilocaine (EMLA) cream ( Topical Given 07/14/24 1236)     IMPRESSION / MDM / ASSESSMENT AND PLAN / ED COURSE  I reviewed the triage vital signs and the nursing  notes.   Differential diagnosis includes, but is not limited to, perianal abscess, thrombosed hemorrhoid, fistula, pelvic abscess.  53 year old female presents to the ED with complaint of perianal pain and unable to sit.  Exam shows a perianal abscess and patient was made aware.  CT scan confirms.  I was able to feel a fluctuant area and patient tolerated I&D of this area well.  She is to follow-up with surgery if any continued problems.  Prescriptions for antibiotics was sent to the pharmacy along with prescription for Percocet as needed for pain.  She is made aware that there is a drain placed in the area and if she is unable to pull it out on her own she should return to the emergency department or follow-up with urgent care.  Also if she develops any worsening of her symptoms especially fever and chills she is to return to the emergency department for reevaluation.  Information about the surgeon on-call was given to the patient as well.      Patient's presentation is most consistent with acute illness / injury with system symptoms.  FINAL CLINICAL IMPRESSION(S) / ED DIAGNOSES   Final diagnoses:  Perianal abscess     Rx / DC Orders   ED Discharge Orders          Ordered    oxyCODONE -acetaminophen  (PERCOCET) 5-325 MG tablet  Every 6 hours PRN        07/14/24 1328    doxycycline  (VIBRAMYCIN ) 100 MG capsule  2 times daily        07/14/24 1328    cephALEXin  (KEFLEX ) 500 MG capsule  3 times daily        07/14/24 1328             Note:  This document was prepared using Dragon voice recognition software and may include unintentional dictation errors.   Saunders Shona CROME, PA-C 07/14/24 1340    Arlander Charleston, MD 07/14/24 1455  "

## 2024-07-14 NOTE — Discharge Instructions (Signed)
 Call make an appoint with Dr. Rodolph who is on-call for surgery if you continue to have problems.  2 prescriptions were sent to the pharmacy to begin taking for antibiotics.  Pain medication was also sent to take every 6 hours as needed for severe pain.  Do not drive or operate machinery while taking this medication as it could cause drowsiness.  The drain that was placed may fall out on its own.  If not and you are unable to pull it out return to the emergency department or urgent care.  If any severe worsening of your symptoms or fever return to the emergency department.

## 2024-07-14 NOTE — ED Triage Notes (Signed)
 Pt comes with hemorrhoids. Pt states intense rectal pain for the last few days. Pt has used otc meds with no relief.

## 2024-07-14 NOTE — ED Notes (Signed)
 See triage note  Presents with possible hemorrhoid pain  States developed pain couple of days ago  Has been using cream w/o relief Has possible abscess area noted on exam

## 2024-07-24 ENCOUNTER — Ambulatory Visit: Payer: Self-pay | Admitting: General Surgery

## 2024-07-24 NOTE — Progress Notes (Addendum)
 " History of Present Illness Tiffany Spence is a 54 year old female with recent perianal abscess who presents for evaluation of recurrent perianal abscess.  She initially developed severe perianal pain on December 29th, 2025, describing the pain as debilitating and limiting ambulation. She initially attributed symptoms to hemorrhoids and attempted self-treatment with topical agents without relief.  She presented to the emergency department on December 29th, 2025, where a CT abdomen identified a perianal abscess. She underwent bedside incision and drainage by a physician assistant and was discharged on doxycycline  and cephalexin . She completed a short course of pain medication and continues the prescribed antibiotics. A drain was placed but subsequently dislodged spontaneously.  Currently, she reports persistent discomfort and a palpable nodule in the perianal region. She denies ongoing drainage. She is not taking any medications other than antibiotics and is not on anticoagulation.      PAST MEDICAL HISTORY:  Medical history reviewed.  No pertinent medical history     PAST SURGICAL HISTORY:   Past Surgical History:  Procedure Laterality Date   ABDOMINAL HYSTERECTOMY W/ PARTIAL VAGINACTOMY  1997   all but 1 ovary   ovary removed Right          MEDICATIONS:  Outpatient Encounter Medications as of 07/24/2024  Medication Sig Dispense Refill   cephalexin  (KEFLEX ) 500 MG capsule Take 500 mg by mouth 3 (three) times daily     doxycycline  (VIBRAMYCIN ) 100 MG capsule Take 100 mg by mouth 2 (two) times daily     ibuprofen  (ADVIL ,MOTRIN ) 800 MG tablet Take by mouth.     No facility-administered encounter medications on file as of 07/24/2024.     ALLERGIES:   Patient has no known allergies.   SOCIAL HISTORY:  Social History   Socioeconomic History   Marital status: Single  Tobacco Use   Smoking status: Former    Types: Cigarettes   Smokeless tobacco: Never  Vaping Use   Vaping  status: Every Day   Substances: Nicotine, Flavoring  Substance and Sexual Activity   Alcohol use: Yes   Drug use: Yes    Comment: marijuana occasionally   Social Drivers of Health   Financial Resource Strain: High Risk (07/24/2024)   Overall Financial Resource Strain (CARDIA)    Difficulty of Paying Living Expenses: Hard  Food Insecurity: Food Insecurity Present (07/24/2024)   Hunger Vital Sign    Worried About Running Out of Food in the Last Year: Sometimes true    Ran Out of Food in the Last Year: Sometimes true  Transportation Needs: Unmet Transportation Needs (07/24/2024)   PRAPARE - Administrator, Civil Service (Medical): Yes    Lack of Transportation (Non-Medical): No    FAMILY HISTORY:  Family History  Problem Relation Name Age of Onset   Diabetes Maternal Aunt       GENERAL REVIEW OF SYSTEMS:   General ROS: negative for - chills, fatigue, fever, weight gain or weight loss Allergy and Immunology ROS: negative for - hives  Hematological and Lymphatic ROS: negative for - bleeding problems or bruising, negative for palpable nodes Endocrine ROS: negative for - heat or cold intolerance, hair changes Respiratory ROS: negative for - cough, shortness of breath or wheezing Cardiovascular ROS: no chest pain or palpitations GI ROS: negative for nausea, vomiting, abdominal pain, diarrhea, constipation Musculoskeletal ROS: negative for - joint swelling or muscle pain Neurological ROS: negative for - confusion, syncope Dermatological ROS: negative for pruritus and rash  PHYSICAL EXAM:  Vitals:  07/24/24 1256  BP: (!) 154/98  Pulse: 94  .  Ht:165.1 cm (5' 5) Wt:(!) 39 kg (86 lb) ADJ:Anib surface area is 1.34 meters squared. Body mass index is 14.31 kg/m.SABRA   GENERAL: Alert, active, oriented x3  HEENT: Pupils equal reactive to light. Extraocular movements are intact. Sclera clear. Palpebral conjunctiva normal red color.Pharynx clear.  NECK: Supple with no  palpable mass and no adenopathy.  LUNGS: Sound clear with no rales rhonchi or wheezes.  HEART: Regular rhythm S1 and S2 without murmur.  ABDOMEN: Soft and depressible, nontender with no palpable mass, no hepatomegaly.   RECTAL: Bulging on the left posterior area with associated cellulitis consistent with a recurrent perianal abscess  EXTREMITIES: Well-developed well-nourished symmetrical with no dependent edema.  NEUROLOGICAL: Awake alert oriented, facial expression symmetrical, moving all extremities.   Results Radiology (I personally evaluated the images) Abdominal CT (07/14/2024): Perirectal abscess on the left lateral perirectal area    Assessment & Plan Recurrent Perianal abscess Recurrent perirectal abscess following prior bedside incision and drainage with incomplete resolution. Examination confirmed recurrence. She is not on anticoagulation and is currently receiving prescribed antibiotics.  Difficult situation due to recurrence.  Will need more advanced anesthesia than just local.  Scheduled operative incision and drainage under sedation at the earliest available opportunity, likely tomorrow. Advised continuation of current antibiotic regimen until the procedure.   Perianal abscess [K61.0]          Patient verbalized understanding, all questions were answered, and were agreeable with the plan outlined above.   Lucas Sjogren, MD  Electronically signed by Lucas Sjogren, MD  Addendum 07/24/2024 at 4:49 PM - Insurance company has not approved incision and drainage of perirectal abscess.  The endorses that they have up to 72 hours to approve this case.  This case is scheduled tomorrow at 7:15 AM due to the urgency of the situation of having a perirectal abscess.  I am available to perform this procedure tomorrow at 7:15 in the morning.  Patient was notified about the situation of not being approved by her insurance company which she will need to pay out-of-pocket if  the medical insurance does not approve the surgery.  Even though I am available to perform the procedure, patient decided to hold until it is approved by her insurance company.  Patient understand the risk of not treating perirectal abscess including worsening of infection, sepsis and even death.  She also decided to hold the surgery at this moment.  Unfortunately the system (her medical insurance) did not care about her wellbeing, being the situation and easy decision of approving the needed surgery in an urgent matter.  "

## 2024-07-25 ENCOUNTER — Ambulatory Visit: Admission: RE | Admit: 2024-07-25 | Source: Home / Self Care | Admitting: General Surgery

## 2024-07-25 ENCOUNTER — Encounter: Admission: RE | Payer: Self-pay | Source: Home / Self Care

## 2024-07-25 SURGERY — INCISION AND DRAINAGE, ABSCESS, PERIRECTAL
Anesthesia: Monitor Anesthesia Care | Site: Perineum

## 2024-07-26 ENCOUNTER — Encounter: Admission: EM | Disposition: A | Payer: Self-pay | Source: Home / Self Care

## 2024-07-26 ENCOUNTER — Emergency Department: Admitting: Certified Registered"

## 2024-07-26 ENCOUNTER — Other Ambulatory Visit: Payer: Self-pay

## 2024-07-26 ENCOUNTER — Observation Stay: Admission: EM | Admit: 2024-07-26 | Discharge: 2024-07-27 | Disposition: A

## 2024-07-26 DIAGNOSIS — K611 Rectal abscess: Principal | ICD-10-CM | POA: Diagnosis present

## 2024-07-26 DIAGNOSIS — F17290 Nicotine dependence, other tobacco product, uncomplicated: Secondary | ICD-10-CM | POA: Insufficient documentation

## 2024-07-26 HISTORY — PX: INCISION AND DRAINAGE PERIRECTAL ABSCESS: SHX1804

## 2024-07-26 LAB — CBC WITH DIFFERENTIAL/PLATELET
Abs Immature Granulocytes: 0.06 K/uL (ref 0.00–0.07)
Basophils Absolute: 0.1 K/uL (ref 0.0–0.1)
Basophils Relative: 0 %
Eosinophils Absolute: 0 K/uL (ref 0.0–0.5)
Eosinophils Relative: 0 %
HCT: 34.3 % — ABNORMAL LOW (ref 36.0–46.0)
Hemoglobin: 11.2 g/dL — ABNORMAL LOW (ref 12.0–15.0)
Immature Granulocytes: 1 %
Lymphocytes Relative: 23 %
Lymphs Abs: 2.6 K/uL (ref 0.7–4.0)
MCH: 30.5 pg (ref 26.0–34.0)
MCHC: 32.7 g/dL (ref 30.0–36.0)
MCV: 93.5 fL (ref 80.0–100.0)
Monocytes Absolute: 0.7 K/uL (ref 0.1–1.0)
Monocytes Relative: 7 %
Neutro Abs: 8 K/uL — ABNORMAL HIGH (ref 1.7–7.7)
Neutrophils Relative %: 69 %
Platelets: 458 K/uL — ABNORMAL HIGH (ref 150–400)
RBC: 3.67 MIL/uL — ABNORMAL LOW (ref 3.87–5.11)
RDW: 13.2 % (ref 11.5–15.5)
WBC: 11.5 K/uL — ABNORMAL HIGH (ref 4.0–10.5)
nRBC: 0 % (ref 0.0–0.2)

## 2024-07-26 LAB — BASIC METABOLIC PANEL WITH GFR
Anion gap: 8 (ref 5–15)
BUN: 9 mg/dL (ref 6–20)
CO2: 26 mmol/L (ref 22–32)
Calcium: 9.2 mg/dL (ref 8.9–10.3)
Chloride: 100 mmol/L (ref 98–111)
Creatinine, Ser: 0.6 mg/dL (ref 0.44–1.00)
GFR, Estimated: >60 mL/min
Glucose, Bld: 96 mg/dL (ref 70–99)
Potassium: 4.3 mmol/L (ref 3.5–5.1)
Sodium: 135 mmol/L (ref 135–145)

## 2024-07-26 MED ORDER — PIPERACILLIN-TAZOBACTAM 3.375 G IVPB
3.3750 g | Freq: Three times a day (TID) | INTRAVENOUS | Status: DC
  Administered 2024-07-26 – 2024-07-27 (×2): 3.375 g via INTRAVENOUS
  Filled 2024-07-26 (×2): qty 50

## 2024-07-26 MED ORDER — OXYCODONE HCL 5 MG/5ML PO SOLN: 5.0000 mg | Freq: Once | ORAL | Status: DC | PRN

## 2024-07-26 MED ORDER — MIDAZOLAM HCL (PF) 2 MG/2ML IJ SOLN
INTRAMUSCULAR | Status: DC | PRN
  Administered 2024-07-26: 2 mg via INTRAVENOUS

## 2024-07-26 MED ORDER — HYDROMORPHONE HCL 1 MG/ML IJ SOLN: 0.2500 mg | INTRAMUSCULAR | Status: DC | PRN

## 2024-07-26 MED ORDER — OXYCODONE HCL 5 MG PO TABS: 5.0000 mg | ORAL_TABLET | ORAL | Status: DC | PRN

## 2024-07-26 MED ORDER — LIDOCAINE HCL (CARDIAC) PF 100 MG/5ML IV SOSY
PREFILLED_SYRINGE | INTRAVENOUS | Status: DC | PRN
  Administered 2024-07-26: 100 mg via INTRAVENOUS

## 2024-07-26 MED ORDER — ONDANSETRON HCL 4 MG/2ML IJ SOLN
INTRAMUSCULAR | Status: AC
  Filled 2024-07-26: qty 20

## 2024-07-26 MED ORDER — MORPHINE SULFATE (PF) 2 MG/ML IV SOLN: 2.0000 mg | INTRAVENOUS | Status: DC | PRN

## 2024-07-26 MED ORDER — LACTATED RINGERS IV SOLN: INTRAVENOUS | Status: DC | PRN

## 2024-07-26 MED ORDER — OXYCODONE HCL 5 MG PO TABS: 5.0000 mg | ORAL_TABLET | Freq: Once | ORAL | Status: DC | PRN

## 2024-07-26 MED ORDER — FENTANYL CITRATE (PF) 100 MCG/2ML IJ SOLN
INTRAMUSCULAR | Status: DC | PRN
  Administered 2024-07-26: 100 ug via INTRAVENOUS

## 2024-07-26 MED ORDER — LIDOCAINE HCL (PF) 2 % IJ SOLN
INTRAMUSCULAR | Status: AC
  Filled 2024-07-26: qty 45

## 2024-07-26 MED ORDER — ENSURE PLUS HIGH PROTEIN PO LIQD
237.0000 mL | Freq: Two times a day (BID) | ORAL | Status: DC
  Administered 2024-07-26: 237 mL via ORAL

## 2024-07-26 MED ORDER — PIPERACILLIN-TAZOBACTAM 3.375 G IVPB
3.3750 g | Freq: Three times a day (TID) | INTRAVENOUS | Status: DC
  Administered 2024-07-26: 3.375 g via INTRAVENOUS
  Filled 2024-07-26: qty 50

## 2024-07-26 MED ORDER — OXYCODONE HCL 5 MG PO TABS: 10.0000 mg | ORAL_TABLET | ORAL | Status: DC | PRN

## 2024-07-26 MED ORDER — ACETAMINOPHEN 500 MG PO TABS
1000.0000 mg | ORAL_TABLET | Freq: Three times a day (TID) | ORAL | Status: DC
  Administered 2024-07-27: 1000 mg via ORAL
  Filled 2024-07-26 (×2): qty 2

## 2024-07-26 MED ORDER — ACETAMINOPHEN 500 MG PO TABS
1000.0000 mg | ORAL_TABLET | Freq: Once | ORAL | Status: AC
  Administered 2024-07-26: 1000 mg via ORAL
  Filled 2024-07-26: qty 2

## 2024-07-26 MED ORDER — ONDANSETRON HCL 4 MG/2ML IJ SOLN: 4.0000 mg | INTRAMUSCULAR | Status: DC | PRN

## 2024-07-26 MED ORDER — DEXMEDETOMIDINE HCL IN NACL 80 MCG/20ML IV SOLN
INTRAVENOUS | Status: AC
  Filled 2024-07-26: qty 20

## 2024-07-26 MED ORDER — DEXAMETHASONE SOD PHOSPHATE PF 10 MG/ML IJ SOLN
INTRAMUSCULAR | Status: AC
  Filled 2024-07-26: qty 8

## 2024-07-26 MED ORDER — PROPOFOL 10 MG/ML IV BOLUS
INTRAVENOUS | Status: DC | PRN
  Administered 2024-07-26: 125 ug/kg/min via INTRAVENOUS

## 2024-07-26 MED ORDER — DEXAMETHASONE SOD PHOSPHATE PF 10 MG/ML IJ SOLN
INTRAMUSCULAR | Status: DC | PRN
  Administered 2024-07-26: 10 mg via INTRAVENOUS

## 2024-07-26 MED ORDER — MIDAZOLAM HCL 2 MG/2ML IJ SOLN
INTRAMUSCULAR | Status: AC
  Filled 2024-07-26: qty 2

## 2024-07-26 MED ORDER — FENTANYL CITRATE (PF) 100 MCG/2ML IJ SOLN
INTRAMUSCULAR | Status: AC
  Filled 2024-07-26: qty 2

## 2024-07-26 MED ORDER — KETOROLAC TROMETHAMINE 30 MG/ML IJ SOLN
INTRAMUSCULAR | Status: AC
  Filled 2024-07-26: qty 8

## 2024-07-26 MED ORDER — PROPOFOL 1000 MG/100ML IV EMUL
INTRAVENOUS | Status: AC
  Filled 2024-07-26: qty 100

## 2024-07-26 MED ORDER — 0.9 % SODIUM CHLORIDE (POUR BTL) OPTIME
TOPICAL | Status: DC | PRN
  Administered 2024-07-26: 500 mL

## 2024-07-26 MED ORDER — OXYCODONE HCL 5 MG PO TABS
5.0000 mg | ORAL_TABLET | Freq: Once | ORAL | Status: AC
  Administered 2024-07-26: 5 mg via ORAL
  Filled 2024-07-26: qty 1

## 2024-07-26 MED ORDER — ROCURONIUM BROMIDE 10 MG/ML (PF) SYRINGE
PREFILLED_SYRINGE | INTRAVENOUS | Status: AC
  Filled 2024-07-26: qty 40

## 2024-07-26 NOTE — Anesthesia Postprocedure Evaluation (Signed)
"   Anesthesia Post Note  Patient: Tiffany Spence  Procedure(s) Performed: INCISION AND DRAINAGE, ABSCESS, PERIRECTAL (Rectum)  Patient location during evaluation: PACU Anesthesia Type: General Level of consciousness: awake and alert Pain management: pain level controlled Vital Signs Assessment: post-procedure vital signs reviewed and stable Respiratory status: spontaneous breathing, nonlabored ventilation, respiratory function stable and patient connected to nasal cannula oxygen Cardiovascular status: blood pressure returned to baseline and stable Postop Assessment: no apparent nausea or vomiting Anesthetic complications: no   No notable events documented.   Last Vitals:  Vitals:   07/26/24 1105 07/26/24 1206  BP: (!) 153/82 (!) 130/100  Pulse: 62 83  Resp: 18 13  Temp: (!) 36.4 C (!) 36.1 C  SpO2: 99% 100%    Last Pain:  Vitals:   07/26/24 1206  TempSrc:   PainSc: Asleep                 Redell MARLA Breaker      "

## 2024-07-26 NOTE — H&P (Signed)
 Patient ID: Tiffany Spence, female   DOB: [birth year], 54 y.o.   MRN: [id number] CC: Perirectal abscess History of Present Illness Tiffany Spence is a 54 y.o. female with no significant past medical history who presents in consultation for perirectal abscess.  The patient's ordeal with this perirectal abscess started on December 29 the previous year.  She started to have pain and thought it was her hemorrhoids.  She self treated with creams.  However the pain persisted so she came to the ED for evaluation.  She had a CT scan that showed a perirectal abscess that had aspiration of this.  She was discharged on antibiotics.  The pain persisted so she followed up with a another surgeon.  She continued to have perirectal abscess and they plan to proceed to the operating room for EUA as well as incision and drainage of perirectal abscess.  However, the system has completely failed this patient and insurance denied this request.  She returns today due to worsening pain.  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  She denies any fevers or chills.  She has been having bowel movements but says that they are hard and small.  She denies any blood in her stool.  She has never had a colonoscopy.  She denies any present drainage from the abscess..  Past Medical History History reviewed. No pertinent past medical history.     Past Surgical History:  Procedure Laterality Date   ABDOMINAL HYSTERECTOMY      [Allergies]  [Allergies] No Known Allergies  No current facility-administered medications for this encounter.   Current Outpatient Medications  Medication Sig Dispense Refill   cephALEXin  (KEFLEX ) 500 MG capsule Take 1 capsule (500 mg total) by mouth 3 (three) times daily. 30 capsule 0   doxycycline  (VIBRAMYCIN ) 100 MG capsule Take 1 capsule (100 mg total) by mouth 2 (two) times daily. 20 capsule 0   oxyCODONE -acetaminophen  (PERCOCET) 5-325  MG tablet Take 1 tablet by mouth every 6 (six) hours as needed for severe pain (pain score 7-10). 15 tablet 0    Family History History reviewed. No pertinent family history.   On daughter whom she lives with  Social History [Social History]   [Social History] Tobacco Use   Smoking status: Some Days  Vaping Use   Vaping status: Some Days  Substance Use Topics   Alcohol use: Yes    Comment: occ   Drug use: Never    Vapes occasionally, social alcohol use   ROS Full ROS of systems performed and is otherwise negative there than what is stated in the HPI  Physical Exam Blood pressure 130/87, pulse 85, temperature 97.6 F (36.4 C), temperature source Oral, resp. rate 20, height 5' 2 (1.575 m), weight 51.2 kg, SpO2 99%.  Thin, alert and oriented x 3, pleasant, normal work of breathing room air, abdomen soft, nontender nondistended, perirectal exam performed.  On the left anterior gluteal fold extending towards the anal verge there is an area of fluctuance that is exquisitely tender to palpation.  There is a small amount of purulent drainage.  Data Reviewed White blood cell count reviewed and it has come down with the antibiotics but still elevated at 11,000.  CT scan from last year reviewed.  She has a fluid collection in the left perirectal space.  There is some gas within this concerning for perirectal abscess.  I have personally reviewed the  patient's imaging and medical records.    Assessment/Plan    Patient with perirectal abscess.  Will proceed to the operating room for exam under anesthesia and incision and drainage of perirectal abscess.  I also discussed with her that this could be secondary to a fistula and if there is a fistula we will place a seton.  I discussed risk benefits alternatives of procedure including risk of infection, bleeding, undrained fluid collection, need for multiple incision and drainages.  She understands these risk and wishes to proceed with  surgery.  We will admit patient and start her on antibiotics.    Jayson MALVA Endow 07/26/2024, 10:32 AM

## 2024-07-26 NOTE — ED Provider Notes (Signed)
 "  [facility] Provider Note    Event Date/Time   First MD Initiated Contact with Patient 07/26/24 [phone]     (approximate)   History   Abscess  Pt to ED for abscess to inner buttock that came back 3 days ago after being aspirated here previously. States was supposed to have surgery but insurance didn't approve it.    HPI Tiffany Spence is a 54 y.o. female presents for evaluation of buttock pain possible abscess -Per chart review, patient was seen in our emergency department on 07/14/2024 for perianal pain.  CT confirmed perianal abscess.  I&D performed in emergency department, plan for follow-up with general surgery.  Discharged with doxycycline , Keflex , Percocet.  Subsequently seen by general surgery in clinic on 07/24/2024.  Noted a perirectal abscess.  Offered drainage on the following day though insurance had not yet covered it, patient decided to defer till insurance coverage confirmed -Unfortunately pain has notably worsened so patient decided to come to emergency department today, feels she can no longer defer intervention.  No fevers.  Has been n.p.o. today.  Has not taken any pain medications.   CT from 07/14/24: IMPRESSION: 1. Positive for a Left perianal and gluteal fold gas and fluid containing Abscess, incompletely visible and larger than 3.6 cm (volume > 10 mL). 2. Thick rind-like rim enhancement of #1, but a paucity of regional inflammation; A necrotic soft tissue tumor is felt less likely. 3. Underlying Cachexia. 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 Advanced abdominal aortic atherosclerosis.     Physical Exam   Triage Vital Signs: ED Triage Vitals  Encounter Vitals Group     BP 07/26/24 0823 130/87     Girls Systolic BP Percentile --      Girls Diastolic BP Percentile --      Boys Systolic BP Percentile --      Boys Diastolic BP Percentile --      Pulse Rate 07/26/24 0823 85     Resp 07/26/24  0823 20     Temp 07/26/24 0823 97.6 F (36.4 C)     Temp Source 07/26/24 0823 Oral     SpO2 07/26/24 0823 99 %     Weight 07/26/24 0821 112 lb 14 oz (51.2 kg)     Height 07/26/24 0821 5' 2 (1.575 m)     Head Circumference --      Peak Flow --      Pain Score 07/26/24 0820 10     Pain Loc --      Pain Education --      Exclude from Growth Chart --     Most recent vital signs: Vitals:   07/26/24 0823 07/26/24 1105  BP: 130/87 (!) 153/82  Pulse: 85 62  Resp: 20 18  Temp: 97.6 F (36.4 C) (!) 97.5 F (36.4 C)  SpO2: 99% 99%     General: Awake, no distress.  CV:  Good peripheral perfusion. RRR, RP 2+ Resp:  Normal effort.  Abd:  No distention. Nontender to deep palpation throughout Rectal:  Chaperoned rectal exam with large 2 cm x 3 cm fluctuant mass at 6-7 o'clock.  Does not tolerate rectal exam due to severe pain.    ED Results / Procedures / Treatments   Labs (all labs ordered are listed, but only abnormal results are displayed) Labs Reviewed  CBC WITH DIFFERENTIAL/PLATELET - Abnormal; Notable for the following components:      Result Value  WBC 11.5 (*)    RBC 3.67 (*)    Hemoglobin 11.2 (*)    HCT 34.3 (*)    Platelets 458 (*)    Neutro Abs 8.0 (*)    All other components within normal limits  BASIC METABOLIC PANEL WITH GFR     EKG  N/a   RADIOLOGY N/a    PROCEDURES:  Critical Care performed: No  Procedures   MEDICATIONS ORDERED IN ED: Medications  piperacillin -tazobactam (ZOSYN ) IVPB 3.375 g (has no administration in time range)  oxyCODONE  (Oxy IR/ROXICODONE ) immediate release tablet 5 mg (5 mg Oral Given 07/26/24 1026)  acetaminophen  (TYLENOL ) tablet 1,000 mg (1,000 mg Oral Given 07/26/24 1026)     IMPRESSION / MDM / ASSESSMENT AND PLAN / ED COURSE  I reviewed the triage vital signs and the nursing notes.                              DDX/MDM/AP: Differential diagnosis includes, but is not limited to, concern for worsening  perirectal abscess.  Fortunately no systemic symptoms.  Failed trial of ED drainage--Will discuss with general surgery.  Plan: - Labs - n.p.o. - pain control - General Surgery consult  Patient's presentation is most consistent with acute presentation with potential threat to life or bodily function.   ED course below.  Discussed with general surgery who agrees OR intervention is appropriate.  Admitted, started on IV antibiotics.  Clinical Course as of 07/26/24 1133  Sat Jul 26, 2024  1002 CBC with mild leukocytosis, downtrending from prior.  Mild anemia. [MM]  1010 Paging general surgery [MM]  1014 Dr. Marinda of gen surg to eval pt shortly [MM]    Clinical Course User Index [MM] Clarine Ozell LABOR, MD     FINAL CLINICAL IMPRESSION(S) / ED DIAGNOSES   Final diagnoses:  Perirectal abscess     Rx / DC Orders   ED Discharge Orders     None        Note:  This document was prepared using Dragon voice recognition software and may include unintentional dictation errors.   Clarine Ozell LABOR, MD 07/26/24 1133  "

## 2024-07-26 NOTE — ED Notes (Signed)
 Patient belongings were placed in two bags. One bag has her coat and stocking hat. The second bag has her purse and clothes, shoes that patient packed herself. Patient removed her jewelry and placed in a specimen container and placed it in her purse. Patient was given a surgical cap to replace her stocking cap. Patient has her cell phone with her.

## 2024-07-26 NOTE — ED Notes (Signed)
 Report given to Darice RN from PACU. Orderly is here for the patient. Zosyn  sent to OR per PACU RN's request.

## 2024-07-26 NOTE — Anesthesia Preprocedure Evaluation (Addendum)
"                                    Anesthesia Evaluation  Patient identified by MRN, date of birth, ID band Patient awake    Reviewed: Allergy & Precautions, H&P , NPO status , Patient's Chart, lab work & pertinent test results  Airway Mallampati: II  TM Distance: >3 FB Neck ROM: Full    Dental no notable dental hx.    Pulmonary Current Smoker   Pulmonary exam normal breath sounds clear to auscultation       Cardiovascular negative cardio ROS Normal cardiovascular exam Rhythm:Regular Rate:Normal     Neuro/Psych negative neurological ROS  negative psych ROS   GI/Hepatic negative GI ROS, Neg liver ROS,,,  Endo/Other  negative endocrine ROS    Renal/GU negative Renal ROS  negative genitourinary   Musculoskeletal negative musculoskeletal ROS (+)    Abdominal   Peds negative pediatric ROS (+)  Hematology negative hematology ROS (+)   Anesthesia Other Findings   Reproductive/Obstetrics negative OB ROS                              Anesthesia Physical Anesthesia Plan  ASA: 2 and emergent  Anesthesia Plan: General   Post-op Pain Management:    Induction:   PONV Risk Score and Plan: 1  Airway Management Planned: LMA  Additional Equipment:   Intra-op Plan:   Post-operative Plan:   Informed Consent: I have reviewed the patients History and Physical, chart, labs and discussed the procedure including the risks, benefits and alternatives for the proposed anesthesia with the patient or authorized representative who has indicated his/her understanding and acceptance.       Plan Discussed with: CRNA  Anesthesia Plan Comments:         Anesthesia Quick Evaluation  "

## 2024-07-26 NOTE — ED Triage Notes (Signed)
 Pt to ED for abscess to inner buttock that came back 3 days ago after being aspirated here previously. States was supposed to have surgery but insurance didn't approve it.

## 2024-07-26 NOTE — Op Note (Signed)
 Operative note  Preoperative diagnosis: Perirectal abscess Postoperative diagnosis: Perirectal abscess Surgeon: Jayson Endow, MD Procedure: Exam under anesthesia, incision and drainage of perirectal abscess  After informed consent was obtained the patient was brought to the operating room and monitored anesthesia care was induced.  She was then placed in lithotomy position.  The perirectal skin was then prepped and draped in the usual sterile fashion.  A surgical timeout was called identifying correct patient, site, side and procedure.  Digital rectal exam was performed and there were no breast masses or lesions.  There is no blood or purulence.  Using anoscopy the distal rectum and anal canal were examined.  There is no evidence of internal drainage of the abscess.  I then turned my attention to the abscess.  Over the most fluctuant part an incision was made and taken down to the abscess cavity.  There is immediate expulsion of approximately 20 cc of pus.  Culture swabs were taken of the abscess cavity.  The abscess cavity was bluntly broken up to clear all loculations.  There is no evidence of connection with the anal canal.  The abscess cavity was then irrigated with warm saline solution.  It was then packed with saline soaked Kerlix.  An ABD pad and mesh panties were placed.  Prior to termination of procedure all sponge and instrument counts were correct x 2.  The patient was then taken to PACU in good condition

## 2024-07-26 NOTE — Anesthesia Procedure Notes (Signed)
 Date/Time: 07/26/2024 11:41 AM  Performed by: Lanice Redell POUR, MDLMA: LMA inserted LMA Size: 3.0

## 2024-07-26 NOTE — Transfer of Care (Signed)
 Immediate Anesthesia Transfer of Care Note  Patient: Tiffany Spence  Procedure(s) Performed: INCISION AND DRAINAGE, ABSCESS, PERIRECTAL (Rectum)  Patient Location: PACU  Anesthesia Type:General  Level of Consciousness: drowsy and patient cooperative  Airway & Oxygen Therapy: Patient Spontanous Breathing and Patient connected to nasal cannula oxygen  Post-op Assessment: Report given to RN and Post -op Vital signs reviewed and stable  Post vital signs: stable  Last Vitals:  Vitals Value Taken Time  BP 130/100 07/26/24 12:05  Temp    Pulse 83 07/26/24 12:07  Resp 11 07/26/24 12:07  SpO2 100 % 07/26/24 12:07  Vitals shown include unfiled device data.  Last Pain:  Vitals:   07/26/24 1105  TempSrc: Oral  PainSc:          Complications: No notable events documented.

## 2024-07-27 ENCOUNTER — Encounter: Payer: Self-pay | Admitting: General Surgery

## 2024-07-27 LAB — HIV ANTIBODY (ROUTINE TESTING W REFLEX): HIV Screen 4th Generation wRfx: NONREACTIVE

## 2024-07-27 MED ORDER — OXYCODONE HCL 5 MG PO TABS: 5.0000 mg | ORAL_TABLET | Freq: Four times a day (QID) | ORAL | 0 refills | Status: AC | PRN

## 2024-07-27 MED ORDER — AMOXICILLIN-POT CLAVULANATE 875-125 MG PO TABS: 1.0000 | ORAL_TABLET | Freq: Two times a day (BID) | ORAL | 0 refills | Status: AC

## 2024-07-27 NOTE — Plan of Care (Signed)

## 2024-07-27 NOTE — Discharge Summary (Signed)
 Patient ID: DALIA JOLLIE MRN: [id number] DOB/AGE: 1970-11-09 54 y.o.  Admit date: 07/26/2024 Discharge date: 07/27/2024   Discharge Diagnoses:  Principal Problem:   Perirectal abscess   Procedures:EUA, I and D of perirectal abscess  Hospital Course:   admitted with findings consistent with acute perirectal abscess and  was taken promptly to the operating room for an uneventful EUA and ID.  Patient was kept overnight.  The time of discharge the patient was ambulating,  pain was controlled.  Her vital signs were stable and she was afebrile.   physical exam at discharge showed a pt  in no acute distress.  Awake and alert.  Abdomen: Soft incisions healing well without infection or peritonitis.  Extremities well-perfused and no edema.  Condition of the patient the time of discharge was stable   Consults: None  Disposition: Discharge disposition: 01-Home or Self Care       Discharge Instructions     Call MD for:  difficulty breathing, headache or visual disturbances   Complete by: As directed    Call MD for:  extreme fatigue   Complete by: As directed    Call MD for:  hives   Complete by: As directed    Call MD for:  persistant dizziness or light-headedness   Complete by: As directed    Call MD for:  persistant nausea and vomiting   Complete by: As directed    Call MD for:  redness, tenderness, or signs of infection (pain, swelling, redness, odor or green/yellow discharge around incision site)   Complete by: As directed    Call MD for:  severe uncontrolled pain   Complete by: As directed    Call MD for:  temperature >100.4   Complete by: As directed    Discharge instructions   Complete by: As directed    Please pack your wound daily for 4 days with moist gauze. You may use saline or distilled water to soak gauze. You may take a shower or bath. You will follow up in 2 weeks in our office.   Increase activity slowly   Complete by: As directed         Follow-up  Information     Marinda Jayson KIDD, MD Follow up in 2 week(s).   Specialty: General Surgery Contact information: [address] [place] [address] [phone]                  Jayson Marinda, M.D.

## 2024-07-29 LAB — AEROBIC/ANAEROBIC CULTURE W GRAM STAIN (SURGICAL/DEEP WOUND)

## 2024-08-05 NOTE — Progress Notes (Signed)
 [facility] Centralized Support Follow Up Note  Purpose:  A [facility] Care Coordinator completed a  phone call to address follow-up SDOH  needs.  Reports from Patient:  The patient states she has not yet had time to look over the resources sent out to her and would like to scheduled additional follow up appointment.  Additional Identified areas of concern: N/A  Plan and Follow up:  No data recorded   [facility] Care Coordinator will schedule follow-up appointment within 7 days.  Next Appointment Date: 08/08/24 at 3pm.  Tiffany Spence    [address]; [place], [place] [address] l  [email or web address] l [phone]   For more information on [facility] services, click here.

## 2024-08-07 ENCOUNTER — Encounter: Payer: Self-pay | Admitting: General Surgery

## 2024-08-07 ENCOUNTER — Ambulatory Visit: Payer: Self-pay | Admitting: General Surgery

## 2024-08-07 VITALS — BP 140/90 | HR 95 | Ht 65.0 in | Wt 91.2 lb

## 2024-08-07 DIAGNOSIS — K611 Rectal abscess: Secondary | ICD-10-CM

## 2024-08-07 NOTE — Patient Instructions (Signed)
 Shower as usual letting the warm soapy water run over the area. Rinse the area well and pat dry. No need to pack the area. May keep a clean dry gauze or pad over the area until it stops draining.   Follow-up with our office in 6 weeks.   Please call and ask to speak with a nurse if you develop questions or concerns.

## 2024-08-22 NOTE — Progress Notes (Signed)
 Outpatient Surgical Follow Up    Tiffany Spence is an 54 y.o. female.   Chief Complaint  Patient presents with   Routine Post Op    HPI: The patient returns today status post incision and drainage of perirectal abscess.  She reports doing well.  She had been packing the wound but is starting to heal in.  She denies any further drainage.  She denies any pain.  She is having normal bowel movements without any blood in them.  No past medical history on file.  Past Surgical History:  Procedure Laterality Date   ABDOMINAL HYSTERECTOMY     INCISION AND DRAINAGE PERIRECTAL ABSCESS N/A 07/26/2024   Procedure: INCISION AND DRAINAGE, ABSCESS, PERIRECTAL;  Surgeon: Marinda Jayson KIDD, MD;  Location: [facility] ORS;  Service: General;  Laterality: N/A;    No family history on file.  Social History:  reports that she has quit smoking. Her smoking use included cigarettes. She has been exposed to tobacco smoke. She has never used smokeless tobacco. She reports current alcohol use. She reports that she does not use drugs.  Allergies: Allergies[1]  Medications reviewed.    ROS Full ROS performed and is otherwise negative other than what is stated in HPI   BP (!) 140/90   Pulse 95   Ht 5' 5 (1.651 m)   Wt 91 lb 3.2 oz (41.4 kg)   SpO2 98%   BMI 15.18 kg/m   Physical Exam  Perirectal exam performed the presence of a chaperone.  Area of I&D is healing in well.  There is some good granulation tissue coming in and there is no signs of undrained fluid collection.   No results found for this or any previous visit (from the past 48 hours). No results found.  Assessment/Plan:  Patient status post I&D of perirectal abscess.  Overall doing well.  Wound is healing in.  We will plan to see her again in 6 weeks for a full examination.  A total of 20 minutes was spent reviewing the patient's chart, performing history and physical and discussing treatment options with the patient   Jayson Marinda,  M.D. [facility]     [1] No Known Allergies

## 2024-08-25 ENCOUNTER — Encounter: Admitting: Obstetrics & Gynecology

## 2024-09-16 ENCOUNTER — Encounter: Payer: Self-pay | Admitting: General Surgery
